# Patient Record
Sex: Female | Born: 1975 | Race: White | Hispanic: No | Marital: Single | Smoking: Former smoker
Health system: Southern US, Community
[De-identification: ages and names within clinical notes are randomized; demographics above are authoritative.]

## PROBLEM LIST (undated history)

## (undated) ENCOUNTER — Inpatient Hospital Stay (HOSPITAL_COMMUNITY): Payer: Self-pay

## (undated) DIAGNOSIS — Z973 Presence of spectacles and contact lenses: Secondary | ICD-10-CM

## (undated) DIAGNOSIS — R51 Headache: Secondary | ICD-10-CM

## (undated) DIAGNOSIS — M1990 Unspecified osteoarthritis, unspecified site: Secondary | ICD-10-CM

## (undated) DIAGNOSIS — F419 Anxiety disorder, unspecified: Secondary | ICD-10-CM

## (undated) DIAGNOSIS — K648 Other hemorrhoids: Secondary | ICD-10-CM

## (undated) DIAGNOSIS — Z8619 Personal history of other infectious and parasitic diseases: Secondary | ICD-10-CM

## (undated) DIAGNOSIS — K644 Residual hemorrhoidal skin tags: Secondary | ICD-10-CM

## (undated) DIAGNOSIS — J189 Pneumonia, unspecified organism: Secondary | ICD-10-CM

## (undated) DIAGNOSIS — G43909 Migraine, unspecified, not intractable, without status migrainosus: Secondary | ICD-10-CM

## (undated) DIAGNOSIS — Z87820 Personal history of traumatic brain injury: Secondary | ICD-10-CM

## (undated) DIAGNOSIS — L409 Psoriasis, unspecified: Secondary | ICD-10-CM

## (undated) DIAGNOSIS — K219 Gastro-esophageal reflux disease without esophagitis: Secondary | ICD-10-CM

## (undated) DIAGNOSIS — F32A Depression, unspecified: Secondary | ICD-10-CM

## (undated) DIAGNOSIS — F329 Major depressive disorder, single episode, unspecified: Secondary | ICD-10-CM

## (undated) HISTORY — PX: FOOT SURGERY: SHX648

---
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
## 2006-02-25 HISTORY — PX: DILATION AND CURETTAGE OF UTERUS: SHX78

## 2006-10-07 ENCOUNTER — Emergency Department (HOSPITAL_COMMUNITY): Admission: EM | Admit: 2006-10-07 | Discharge: 2006-10-07 | Payer: Self-pay | Admitting: Emergency Medicine

## 2006-10-30 ENCOUNTER — Emergency Department (HOSPITAL_COMMUNITY): Admission: EM | Admit: 2006-10-30 | Discharge: 2006-10-30 | Payer: Self-pay | Admitting: Emergency Medicine

## 2006-10-31 ENCOUNTER — Inpatient Hospital Stay (HOSPITAL_COMMUNITY): Admission: AD | Admit: 2006-10-31 | Discharge: 2006-10-31 | Payer: Self-pay | Admitting: Obstetrics & Gynecology

## 2006-11-06 ENCOUNTER — Inpatient Hospital Stay (HOSPITAL_COMMUNITY): Admission: AD | Admit: 2006-11-06 | Discharge: 2006-11-06 | Payer: Self-pay | Admitting: Obstetrics & Gynecology

## 2006-11-07 ENCOUNTER — Inpatient Hospital Stay (HOSPITAL_COMMUNITY): Admission: AD | Admit: 2006-11-07 | Discharge: 2006-11-07 | Payer: Self-pay | Admitting: Obstetrics & Gynecology

## 2006-11-13 ENCOUNTER — Inpatient Hospital Stay (HOSPITAL_COMMUNITY): Admission: RE | Admit: 2006-11-13 | Discharge: 2006-11-13 | Payer: Self-pay | Admitting: Obstetrics & Gynecology

## 2006-12-18 ENCOUNTER — Emergency Department (HOSPITAL_COMMUNITY): Admission: EM | Admit: 2006-12-18 | Discharge: 2006-12-18 | Payer: Self-pay | Admitting: Emergency Medicine

## 2007-04-21 ENCOUNTER — Inpatient Hospital Stay (HOSPITAL_COMMUNITY): Admission: AD | Admit: 2007-04-21 | Discharge: 2007-04-21 | Payer: Self-pay | Admitting: Obstetrics and Gynecology

## 2007-06-23 ENCOUNTER — Inpatient Hospital Stay (HOSPITAL_COMMUNITY): Admission: AD | Admit: 2007-06-23 | Discharge: 2007-06-25 | Payer: Self-pay | Admitting: Obstetrics and Gynecology

## 2007-11-17 ENCOUNTER — Inpatient Hospital Stay (HOSPITAL_COMMUNITY): Admission: AD | Admit: 2007-11-17 | Discharge: 2007-11-17 | Payer: Self-pay | Admitting: Obstetrics and Gynecology

## 2007-12-17 ENCOUNTER — Ambulatory Visit: Payer: Self-pay | Admitting: Obstetrics & Gynecology

## 2007-12-17 ENCOUNTER — Encounter: Payer: Self-pay | Admitting: Physician Assistant

## 2007-12-17 LAB — CONVERTED CEMR LAB
MCHC: 32.8 g/dL (ref 30.0–36.0)
RDW: 12.7 % (ref 11.5–15.5)
TSH: 0.454 microintl units/mL (ref 0.350–4.50)

## 2008-05-08 ENCOUNTER — Emergency Department (HOSPITAL_COMMUNITY): Admission: EM | Admit: 2008-05-08 | Discharge: 2008-05-08 | Payer: Self-pay | Admitting: Family Medicine

## 2008-11-01 ENCOUNTER — Ambulatory Visit: Payer: Self-pay | Admitting: Family Medicine

## 2008-11-01 ENCOUNTER — Inpatient Hospital Stay (HOSPITAL_COMMUNITY): Admission: AD | Admit: 2008-11-01 | Discharge: 2008-11-01 | Payer: Self-pay | Admitting: Family Medicine

## 2008-11-02 ENCOUNTER — Encounter: Admission: RE | Admit: 2008-11-02 | Discharge: 2008-11-02 | Payer: Self-pay | Admitting: Family Medicine

## 2008-11-13 ENCOUNTER — Inpatient Hospital Stay (HOSPITAL_COMMUNITY): Admission: AD | Admit: 2008-11-13 | Discharge: 2008-11-13 | Payer: Self-pay | Admitting: Obstetrics and Gynecology

## 2009-04-01 ENCOUNTER — Inpatient Hospital Stay (HOSPITAL_COMMUNITY): Admission: AD | Admit: 2009-04-01 | Discharge: 2009-04-01 | Payer: Self-pay | Admitting: Obstetrics & Gynecology

## 2009-05-18 ENCOUNTER — Ambulatory Visit: Payer: Self-pay | Admitting: Obstetrics and Gynecology

## 2009-05-25 ENCOUNTER — Inpatient Hospital Stay (HOSPITAL_COMMUNITY): Admission: AD | Admit: 2009-05-25 | Discharge: 2009-05-25 | Payer: Self-pay | Admitting: Family Medicine

## 2009-09-13 ENCOUNTER — Inpatient Hospital Stay (HOSPITAL_COMMUNITY): Admission: AD | Admit: 2009-09-13 | Discharge: 2009-09-13 | Payer: Self-pay | Admitting: Obstetrics & Gynecology

## 2009-12-05 ENCOUNTER — Emergency Department (HOSPITAL_COMMUNITY): Admission: EM | Admit: 2009-12-05 | Discharge: 2009-12-05 | Payer: Self-pay | Admitting: Emergency Medicine

## 2009-12-12 ENCOUNTER — Emergency Department (HOSPITAL_COMMUNITY): Admission: EM | Admit: 2009-12-12 | Discharge: 2009-12-12 | Payer: Self-pay | Admitting: Family Medicine

## 2010-01-01 ENCOUNTER — Ambulatory Visit: Payer: Self-pay | Admitting: Obstetrics and Gynecology

## 2010-01-01 ENCOUNTER — Encounter (INDEPENDENT_AMBULATORY_CARE_PROVIDER_SITE_OTHER): Payer: Self-pay | Admitting: *Deleted

## 2010-01-01 LAB — CONVERTED CEMR LAB: Chlamydia, DNA Probe: NEGATIVE

## 2010-01-02 ENCOUNTER — Encounter (INDEPENDENT_AMBULATORY_CARE_PROVIDER_SITE_OTHER): Payer: Self-pay | Admitting: *Deleted

## 2010-01-02 LAB — CONVERTED CEMR LAB: Yeast Wet Prep HPF POC: NONE SEEN

## 2010-03-19 ENCOUNTER — Ambulatory Visit: Admit: 2010-03-19 | Payer: Self-pay | Admitting: Obstetrics and Gynecology

## 2010-03-22 ENCOUNTER — Ambulatory Visit: Admission: RE | Admit: 2010-03-22 | Payer: Self-pay | Source: Home / Self Care | Admitting: Obstetrics and Gynecology

## 2010-04-05 ENCOUNTER — Ambulatory Visit: Payer: Self-pay | Admitting: Physician Assistant

## 2010-04-30 ENCOUNTER — Other Ambulatory Visit (HOSPITAL_COMMUNITY)
Admission: RE | Admit: 2010-04-30 | Discharge: 2010-04-30 | Disposition: A | Discharge status: Home / Self Care | Payer: Self-pay | Source: Ambulatory Visit | Attending: Obstetrics & Gynecology | Admitting: Obstetrics & Gynecology

## 2010-04-30 ENCOUNTER — Ambulatory Visit: Payer: Self-pay | Admitting: Occupational Therapy

## 2010-04-30 ENCOUNTER — Other Ambulatory Visit: Payer: Self-pay

## 2010-04-30 DIAGNOSIS — Z113 Encounter for screening for infections with a predominantly sexual mode of transmission: Secondary | ICD-10-CM | POA: Insufficient documentation

## 2010-04-30 DIAGNOSIS — Z01419 Encounter for gynecological examination (general) (routine) without abnormal findings: Secondary | ICD-10-CM | POA: Insufficient documentation

## 2010-04-30 LAB — POCT PREGNANCY, URINE: Preg Test, Ur: NEGATIVE

## 2010-05-01 ENCOUNTER — Encounter (INDEPENDENT_AMBULATORY_CARE_PROVIDER_SITE_OTHER): Payer: Self-pay | Admitting: *Deleted

## 2010-05-01 LAB — CONVERTED CEMR LAB
Trich, Wet Prep: NONE SEEN
WBC, Wet Prep HPF POC: NONE SEEN
Yeast Wet Prep HPF POC: NONE SEEN

## 2010-05-08 LAB — POCT PREGNANCY, URINE: Preg Test, Ur: NEGATIVE

## 2010-05-12 LAB — CBC
HCT: 41 % (ref 36.0–46.0)
MCH: 32.2 pg (ref 26.0–34.0)
MCV: 93.1 fL (ref 78.0–100.0)
Platelets: 230 10*3/uL (ref 150–400)
RBC: 4.4 MIL/uL (ref 3.87–5.11)

## 2010-05-12 LAB — WET PREP, GENITAL
Clue Cells Wet Prep HPF POC: NONE SEEN
Trich, Wet Prep: NONE SEEN

## 2010-05-12 LAB — URINALYSIS, ROUTINE W REFLEX MICROSCOPIC
Bilirubin Urine: NEGATIVE
Hgb urine dipstick: NEGATIVE
Ketones, ur: NEGATIVE mg/dL
Nitrite: NEGATIVE
pH: 5.5 (ref 5.0–8.0)

## 2010-05-12 LAB — GC/CHLAMYDIA PROBE AMP, GENITAL: Chlamydia, DNA Probe: NEGATIVE

## 2010-05-14 ENCOUNTER — Other Ambulatory Visit: Payer: Self-pay

## 2010-05-16 LAB — URINALYSIS, ROUTINE W REFLEX MICROSCOPIC
Bilirubin Urine: NEGATIVE
Ketones, ur: 15 mg/dL — AB
Nitrite: NEGATIVE
Protein, ur: NEGATIVE mg/dL
Urobilinogen, UA: 0.2 mg/dL (ref 0.0–1.0)
pH: 5 (ref 5.0–8.0)

## 2010-05-16 LAB — GC/CHLAMYDIA PROBE AMP, GENITAL: Chlamydia, DNA Probe: NEGATIVE
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
## 2010-05-20 LAB — WET PREP, GENITAL
Clue Cells Wet Prep HPF POC: NONE SEEN
Trich, Wet Prep: NONE SEEN

## 2010-05-20 LAB — GC/CHLAMYDIA PROBE AMP, GENITAL
Chlamydia, DNA Probe: NEGATIVE
GC Probe Amp, Genital: NEGATIVE

## 2010-06-25 ENCOUNTER — Inpatient Hospital Stay (HOSPITAL_COMMUNITY)
Admission: AD | Admit: 2010-06-25 | Discharge: 2010-06-25 | Disposition: A | Discharge status: Home / Self Care | Payer: Self-pay | Source: Ambulatory Visit | Attending: Obstetrics & Gynecology | Admitting: Obstetrics & Gynecology

## 2010-06-25 DIAGNOSIS — G43909 Migraine, unspecified, not intractable, without status migrainosus: Secondary | ICD-10-CM

## 2010-06-25 LAB — URINALYSIS, ROUTINE W REFLEX MICROSCOPIC
Glucose, UA: NEGATIVE mg/dL
Hgb urine dipstick: NEGATIVE
Ketones, ur: NEGATIVE mg/dL
Protein, ur: NEGATIVE mg/dL
pH: 5 (ref 5.0–8.0)

## 2010-06-25 LAB — WET PREP, GENITAL
Clue Cells Wet Prep HPF POC: NONE SEEN
Yeast Wet Prep HPF POC: NONE SEEN

## 2010-06-26 LAB — GC/CHLAMYDIA PROBE AMP, GENITAL: GC Probe Amp, Genital: NEGATIVE

## 2010-06-27 LAB — HERPES SIMPLEX VIRUS CULTURE
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
## 2010-07-16 ENCOUNTER — Ambulatory Visit: Payer: Self-pay | Admitting: Advanced Practice Midwife

## 2010-07-16 ENCOUNTER — Ambulatory Visit: Payer: Self-pay | Admitting: Physician Assistant

## 2010-07-26 ENCOUNTER — Inpatient Hospital Stay (HOSPITAL_COMMUNITY): Payer: Self-pay

## 2010-07-26 ENCOUNTER — Inpatient Hospital Stay (HOSPITAL_COMMUNITY)
Admission: AD | Admit: 2010-07-26 | Discharge: 2010-07-26 | Disposition: A | Discharge status: Home / Self Care | Payer: Self-pay | Source: Ambulatory Visit | Attending: Obstetrics & Gynecology | Admitting: Obstetrics & Gynecology

## 2010-07-26 DIAGNOSIS — R109 Unspecified abdominal pain: Secondary | ICD-10-CM | POA: Insufficient documentation

## 2010-07-26 DIAGNOSIS — IMO0002 Reserved for concepts with insufficient information to code with codable children: Secondary | ICD-10-CM | POA: Insufficient documentation

## 2010-07-26 LAB — URINALYSIS, ROUTINE W REFLEX MICROSCOPIC
Ketones, ur: NEGATIVE mg/dL
Nitrite: NEGATIVE
Protein, ur: NEGATIVE mg/dL
Urobilinogen, UA: 0.2 mg/dL (ref 0.0–1.0)

## 2010-07-26 LAB — WET PREP, GENITAL
Clue Cells Wet Prep HPF POC: NONE SEEN
Yeast Wet Prep HPF POC: NONE SEEN

## 2010-07-26 LAB — CBC
Platelets: 217 10*3/uL (ref 150–400)
RBC: 4.09 MIL/uL (ref 3.87–5.11)
RDW: 12.3 % (ref 11.5–15.5)
WBC: 4.3 10*3/uL (ref 4.0–10.5)

## 2010-07-26 LAB — DIFFERENTIAL
Basophils Relative: 1 % (ref 0–1)
Eosinophils Absolute: 0 10*3/uL (ref 0.0–0.7)
Eosinophils Relative: 1 % (ref 0–5)
Neutrophils Relative %: 65 % (ref 43–77)

## 2010-07-26 LAB — POCT PREGNANCY, URINE: Preg Test, Ur: NEGATIVE

## 2010-08-10 ENCOUNTER — Ambulatory Visit: Payer: Self-pay | Admitting: Advanced Practice Midwife

## 2010-09-03 ENCOUNTER — Telehealth: Payer: Self-pay | Admitting: *Deleted

## 2010-09-03 NOTE — Telephone Encounter (Signed)
Patient has questions regarding birth control pills and no period.  States she had a negative urine pregnancy test.  Is this possible to skip a period on pills or do I need to take another pregnancy test.  Spoke with patient via phone.  Patient states she had Depo Provera shot in October and then decided not to continue it.  States she has only had one period since injection in October.  States she has completed one pack of pills and did not have a period.  She states she had a negative pregnancy test and started her second pack of pills.  Explained to patient it may take a few months to regulate her periods after having been on depo provera.  Patient states understanding.  Encouraged to call back with additional questions.

## 2010-09-12 ENCOUNTER — Inpatient Hospital Stay (INDEPENDENT_AMBULATORY_CARE_PROVIDER_SITE_OTHER)
Admission: RE | Admit: 2010-09-12 | Discharge: 2010-09-12 | Disposition: A | Discharge status: Home / Self Care | Payer: Self-pay | Source: Ambulatory Visit | Attending: Family Medicine | Admitting: Family Medicine

## 2010-09-12 DIAGNOSIS — F411 Generalized anxiety disorder: Secondary | ICD-10-CM

## 2010-09-21 ENCOUNTER — Other Ambulatory Visit: Payer: Self-pay | Admitting: *Deleted

## 2010-09-21 NOTE — Telephone Encounter (Signed)
09/19/10 4:17 pt. Left message she had called 2 weeks ago trying to get fioricet renewed, spoke to someone said would check with doctor and call her back- wants to know if it was renewed. 09/20/10 0-0916 per note in chart  Per Maylon Cos, CNM- pt cannot get renewal without being seen because has [facility] x2, front office left message for pt 09/14/10 offering pt an appointment that day, also made appt 10/25/10 in case she wanted that.  Today called pt and left message we are returning your call- please call clinic to discuss your request. Raynald Blend, RN 09/21/10 0901 called pt and left message we are trying to return your call- if you still need assistance, please call clinic.

## 2010-09-24 ENCOUNTER — Telehealth: Payer: Self-pay

## 2010-09-24 NOTE — Telephone Encounter (Signed)
Called pt and left message to return our call.

## 2010-09-26 ENCOUNTER — Telehealth: Payer: Self-pay | Admitting: *Deleted

## 2010-09-26 NOTE — Telephone Encounter (Signed)
Left message for patient that she needs to schedule appointment and that we will not refill Rx.

## 2010-09-27 ENCOUNTER — Telehealth: Payer: Self-pay | Admitting: *Deleted

## 2010-09-27 NOTE — Telephone Encounter (Signed)
Called pt again, left message for pt to call clinic- we are returning her call and trying to answer her question- pt requested rx refill for flexeril- that was denied- needs to be seen in clinic as she has dnka 2 appointments- has appt made 10/25/10

## 2010-09-27 NOTE — Telephone Encounter (Signed)
Pt returned our call 09/26/10 at 4:04 stating she understands she must be seen before gets new rx, knows appointment is August 30th , but asks what do I do between now and then?  Called pt. And left message if she can not wait until her appointment can go to [facility] or her primary care MD

## 2010-10-12 ENCOUNTER — Inpatient Hospital Stay (HOSPITAL_COMMUNITY)
Admission: AD | Admit: 2010-10-12 | Discharge: 2010-10-12 | Disposition: A | Discharge status: Home / Self Care | Payer: Self-pay | Source: Ambulatory Visit | Attending: Obstetrics & Gynecology | Admitting: Obstetrics & Gynecology

## 2010-10-12 ENCOUNTER — Encounter (HOSPITAL_COMMUNITY): Payer: Self-pay | Admitting: Obstetrics and Gynecology

## 2010-10-12 DIAGNOSIS — L408 Other psoriasis: Secondary | ICD-10-CM | POA: Insufficient documentation

## 2010-10-12 DIAGNOSIS — N9089 Other specified noninflammatory disorders of vulva and perineum: Secondary | ICD-10-CM

## 2010-10-12 DIAGNOSIS — N909 Noninflammatory disorder of vulva and perineum, unspecified: Secondary | ICD-10-CM | POA: Insufficient documentation

## 2010-10-12 DIAGNOSIS — L259 Unspecified contact dermatitis, unspecified cause: Secondary | ICD-10-CM | POA: Insufficient documentation

## 2010-10-12 HISTORY — DX: Anxiety disorder, unspecified: F41.9

## 2010-10-12 HISTORY — DX: Major depressive disorder, single episode, unspecified: F32.9

## 2010-10-12 HISTORY — DX: Depression, unspecified: F32.A

## 2010-10-12 LAB — WET PREP, GENITAL
Trich, Wet Prep: NONE SEEN
Yeast Wet Prep HPF POC: NONE SEEN

## 2010-10-12 MED ORDER — NYSTATIN-TRIAMCINOLONE 100000-0.1 UNIT/GM-% EX OINT: TOPICAL_OINTMENT | Freq: Two times a day (BID) | CUTANEOUS | Status: DC

## 2010-10-12 MED ORDER — BUTALBITAL-APAP-CAFFEINE 50-325-40 MG PO TABS: 1.0000 | ORAL_TABLET | Freq: Four times a day (QID) | ORAL | Status: DC | PRN

## 2010-10-12 MED ORDER — NYSTATIN-TRIAMCINOLONE 100000-0.1 UNIT/GM-% EX CREA
TOPICAL_CREAM | Freq: Two times a day (BID) | CUTANEOUS | Status: DC
  Filled 2010-10-12: qty 15

## 2010-10-12 NOTE — Progress Notes (Signed)
Pt statesd, " I started having white vaginal discharge on Wed, and today I have vaginal irritation that started today."

## 2010-10-12 NOTE — ED Provider Notes (Addendum)
History     Chief Complaint  Patient presents with  . Vaginal Discharge  . Vaginal Pain   HPI Pt complains fo whtie vaginal discharge for 2 days with irritation.  She is using Sprintec birth control.  She has an appointment with the GYN for bx of vulvar   Past Medical History  Diagnosis Date  . Anxiety   . Depression     Past Surgical History  Procedure Date  . Foot surgery 1998 & 2000    Bone spur removal from both feet  . Wisdom tooth extraction 1997    No family history on file.  History  Substance Use Topics  . Smoking status: Former Smoker -- 0.2 packs/day for 10 years    Types: Cigarettes  . Smokeless tobacco: Not on file  . Alcohol Use: 0.6 oz/week    1 Glasses of wine per week     occassionally    Allergies: No Known Allergies  No prescriptions prior to admission    ROS Physical Exam   Blood pressure 128/84, pulse 76, temperature 99.4 F (37.4 C), temperature source Oral, resp. rate 20, height 5' 2.5" (1.588 m), weight 120 lb 8 oz (54.658 kg), last menstrual period 09/25/2010.  Physical Exam  Vitals reviewed. Constitutional: She is oriented to person, place, and time. She appears well-developed and well-nourished.  HENT:  Head: Normocephalic.  Eyes: Pupils are equal, round, and reactive to light.  Neck: Normal range of motion. Neck supple.  Respiratory: Effort normal.  Genitourinary:       Pt has left scaly, slightly flaky reddened area that has a small fissure between the labia minora and the labia minora- appears to be psoriasis or eczema- pt says she also has some patches on her scalp.  Vagina is clean with small amount of white discharge, mucosa pale pink; cervix clean nontender; adnexal without palpable enlargement or tenderness.  Pt is very anxious and concerned this may be cancer or an STD.  Musculoskeletal: Normal range of motion.  Neurological: She is alert and oriented to person, place, and time. She has normal reflexes.  Skin: Skin is  warm and dry.    [facility] Course  Procedures Pelvic exam performed-with wet prep and GC/chlamdyia cultures MDM Will treat vulvar irritation with Mycolog  Pt requests Fioricet for headaches until she can get another presciption.  Discussed menstrual migraines (without aura) and recommended pt do continuous birth control pills for 3 months and only take 4 days off of active pills.   Pt will discuss at GYN visit Assessment and Plan  Vulvar irritation likely psoriasis or eczema will treat with mod steriod and have pt f/u in GYN clinic (nystatin/triamcinolone)- pharmacy has cream but will give prescription for ointment as that may work better.  Taneshia Lorence 10/12/2010, 8:09 PM

## 2010-10-13 LAB — GC/CHLAMYDIA PROBE AMP, GENITAL: GC Probe Amp, Genital: NEGATIVE

## 2010-10-25 ENCOUNTER — Ambulatory Visit: Payer: Self-pay | Admitting: Physician Assistant

## 2010-11-19 DIAGNOSIS — Z87898 Personal history of other specified conditions: Secondary | ICD-10-CM | POA: Insufficient documentation

## 2010-11-19 DIAGNOSIS — Z8669 Personal history of other diseases of the nervous system and sense organs: Secondary | ICD-10-CM | POA: Insufficient documentation

## 2010-11-20 LAB — CBC
HCT: 32.1 — ABNORMAL LOW
Hemoglobin: 9.6 — ABNORMAL LOW
MCHC: 35.4
MCV: 92.6
Platelets: 232
Platelets: 257
RDW: 13.3
RDW: 14
WBC: 8.8

## 2010-11-20 LAB — RPR: RPR Ser Ql: NONREACTIVE

## 2010-11-21 ENCOUNTER — Encounter: Payer: Self-pay | Admitting: Family Medicine

## 2010-11-21 ENCOUNTER — Ambulatory Visit: Payer: Self-pay | Admitting: Obstetrics and Gynecology

## 2010-11-26 LAB — URINALYSIS, ROUTINE W REFLEX MICROSCOPIC
Hgb urine dipstick: NEGATIVE
Nitrite: NEGATIVE
Protein, ur: NEGATIVE
Specific Gravity, Urine: 1.025
Urobilinogen, UA: 0.2

## 2010-11-26 LAB — GC/CHLAMYDIA PROBE AMP, GENITAL: GC Probe Amp, Genital: NEGATIVE

## 2010-11-26 LAB — WET PREP, GENITAL
Clue Cells Wet Prep HPF POC: NONE SEEN
Yeast Wet Prep HPF POC: NONE SEEN

## 2010-12-07 LAB — URINALYSIS, ROUTINE W REFLEX MICROSCOPIC
Hgb urine dipstick: NEGATIVE
Nitrite: NEGATIVE
Specific Gravity, Urine: 1.01
Urobilinogen, UA: 0.2

## 2010-12-07 LAB — URINE MICROSCOPIC-ADD ON

## 2010-12-07 LAB — POCT PREGNANCY, URINE
Preg Test, Ur: NEGATIVE
Preg Test, Ur: POSITIVE

## 2010-12-07 LAB — CBC
MCHC: 35.7
MCV: 89.4
Platelets: 302
RBC: 4.18
RDW: 11.9

## 2010-12-07 LAB — ABO/RH: ABO/RH(D): B POS

## 2010-12-07 LAB — HCG, QUANTITATIVE, PREGNANCY: hCG, Beta Chain, Quant, S: 560 — ABNORMAL HIGH

## 2010-12-07 LAB — WET PREP, GENITAL

## 2010-12-08 ENCOUNTER — Encounter (HOSPITAL_BASED_OUTPATIENT_CLINIC_OR_DEPARTMENT_OTHER): Payer: Self-pay | Admitting: *Deleted

## 2010-12-08 ENCOUNTER — Emergency Department (HOSPITAL_BASED_OUTPATIENT_CLINIC_OR_DEPARTMENT_OTHER)
Admission: EM | Admit: 2010-12-08 | Discharge: 2010-12-08 | Disposition: A | Discharge status: Home / Self Care | Payer: Self-pay | Attending: Emergency Medicine | Admitting: Emergency Medicine

## 2010-12-08 DIAGNOSIS — Z79899 Other long term (current) drug therapy: Secondary | ICD-10-CM | POA: Insufficient documentation

## 2010-12-08 DIAGNOSIS — F341 Dysthymic disorder: Secondary | ICD-10-CM | POA: Insufficient documentation

## 2010-12-08 DIAGNOSIS — K1379 Other lesions of oral mucosa: Secondary | ICD-10-CM

## 2010-12-08 DIAGNOSIS — K1370 Unspecified lesions of oral mucosa: Secondary | ICD-10-CM | POA: Insufficient documentation

## 2010-12-08 HISTORY — DX: Psoriasis, unspecified: L40.9

## 2010-12-08 MED ORDER — AMOXICILLIN 500 MG PO CAPS
500.0000 mg | ORAL_CAPSULE | Freq: Once | ORAL | Status: AC
  Administered 2010-12-08: 500 mg via ORAL
  Filled 2010-12-08: qty 1

## 2010-12-08 MED ORDER — AMOXICILLIN 500 MG PO CAPS: 500.0000 mg | ORAL_CAPSULE | Freq: Three times a day (TID) | ORAL | Status: AC

## 2010-12-08 NOTE — ED Notes (Signed)
Pt states she noticed a lump on the inside of her left cheek on Wed. Now c/o irritation to same. Googled and is now concerned she has cancer.

## 2010-12-08 NOTE — ED Notes (Signed)
Pt has escript for amoxicillin called in- verbalizes understanding
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
## 2010-12-08 NOTE — ED Provider Notes (Signed)
Medical screening examination/treatment/procedure(s) were performed by non-physician practitioner and as supervising physician I was immediately available for consultation/collaboration.   Hanley Seamen, MD 12/08/10 2053

## 2010-12-18 ENCOUNTER — Other Ambulatory Visit: Payer: Self-pay | Admitting: Obstetrics & Gynecology

## 2010-12-24 ENCOUNTER — Inpatient Hospital Stay (HOSPITAL_COMMUNITY)
Admission: AD | Admit: 2010-12-24 | Discharge: 2010-12-24 | Disposition: A | Discharge status: Home / Self Care | Payer: Self-pay | Source: Ambulatory Visit | Attending: Obstetrics & Gynecology | Admitting: Obstetrics & Gynecology

## 2010-12-24 DIAGNOSIS — K644 Residual hemorrhoidal skin tags: Secondary | ICD-10-CM | POA: Insufficient documentation

## 2010-12-24 DIAGNOSIS — N760 Acute vaginitis: Secondary | ICD-10-CM | POA: Insufficient documentation

## 2010-12-24 LAB — WET PREP, GENITAL
Clue Cells Wet Prep HPF POC: NONE SEEN
Trich, Wet Prep: NONE SEEN
Yeast Wet Prep HPF POC: NONE SEEN
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
## 2010-12-24 NOTE — Progress Notes (Signed)
Patient states she woke up this am with a vaginal odor and a slight white discharge with slight itching. Patient states she used OTC medication for yeast last week.

## 2010-12-25 LAB — GC/CHLAMYDIA PROBE AMP, GENITAL: GC Probe Amp, Genital: NEGATIVE

## 2010-12-27 NOTE — ED Provider Notes (Signed)
Attestation of Attending Supervision of Advanced Practitioner: Evaluation and management procedures were performed by the PA/NP/CNM/OB Fellow under my supervision/collaboration. Chart reviewed and agree with management and plan.  Melenie Minniear A M.D. 12/27/2010 2:53 PM   

## 2011-01-21 ENCOUNTER — Inpatient Hospital Stay (HOSPITAL_COMMUNITY)
Admission: AD | Admit: 2011-01-21 | Discharge: 2011-01-21 | Disposition: A | Discharge status: Home / Self Care | Payer: Self-pay | Source: Ambulatory Visit | Attending: Obstetrics & Gynecology | Admitting: Obstetrics & Gynecology

## 2011-01-21 ENCOUNTER — Encounter (HOSPITAL_COMMUNITY): Payer: Self-pay | Admitting: *Deleted

## 2011-01-21 DIAGNOSIS — K649 Unspecified hemorrhoids: Secondary | ICD-10-CM | POA: Insufficient documentation

## 2011-01-21 DIAGNOSIS — K644 Residual hemorrhoidal skin tags: Secondary | ICD-10-CM

## 2011-01-21 HISTORY — DX: Headache: R51

## 2011-01-21 MED ORDER — HYDROCORTISONE ACE-PRAMOXINE 1-1 % RE FOAM
1.0000 | Freq: Two times a day (BID) | RECTAL | Status: DC
  Filled 2011-01-21: qty 10

## 2011-01-21 MED ORDER — HYDROCORTISONE ACE-PRAMOXINE 1-1 % RE FOAM: 1.0000 | Freq: Two times a day (BID) | RECTAL | Status: AC

## 2011-01-21 NOTE — Progress Notes (Signed)
Has hemorrhoids.  Was really itching, tried the creams, ? If open, might be one or two tears

## 2011-01-21 NOTE — Progress Notes (Signed)
Denies pain, just itching and burning.
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
## 2011-02-02 ENCOUNTER — Inpatient Hospital Stay (HOSPITAL_COMMUNITY)
Admission: AD | Admit: 2011-02-02 | Discharge: 2011-02-02 | Disposition: A | Discharge status: Home / Self Care | Payer: Self-pay | Source: Ambulatory Visit | Attending: Obstetrics and Gynecology | Admitting: Obstetrics and Gynecology

## 2011-02-02 ENCOUNTER — Encounter (HOSPITAL_COMMUNITY): Payer: Self-pay | Admitting: *Deleted

## 2011-02-02 DIAGNOSIS — N760 Acute vaginitis: Secondary | ICD-10-CM

## 2011-02-02 DIAGNOSIS — N762 Acute vulvitis: Secondary | ICD-10-CM

## 2011-02-02 DIAGNOSIS — N949 Unspecified condition associated with female genital organs and menstrual cycle: Secondary | ICD-10-CM | POA: Insufficient documentation

## 2011-02-02 DIAGNOSIS — J069 Acute upper respiratory infection, unspecified: Secondary | ICD-10-CM | POA: Insufficient documentation

## 2011-02-02 MED ORDER — NYSTATIN-TRIAMCINOLONE 100000-0.1 UNIT/GM-% EX CREA: TOPICAL_CREAM | CUTANEOUS | Status: DC

## 2011-02-02 MED ORDER — OSELTAMIVIR PHOSPHATE 75 MG PO CAPS: 75.0000 mg | ORAL_CAPSULE | Freq: Two times a day (BID) | ORAL | Status: DC

## 2011-02-02 NOTE — Progress Notes (Signed)
Pt in c/o vaginal itching and irritation after intercourse yesterday.  Also reports throat redness and pain with swallowing since 0400.  Reports dryness in throat x1 month.
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
## 2011-02-02 NOTE — Progress Notes (Signed)
Pt had intercourse yesterday and started having vaginal burning/irritation. Also reports having throat/ear pain like she is coming down with Strep.Feels ache all over.

## 2011-02-03 NOTE — ED Provider Notes (Signed)
Agree with above note.  [name] 02/03/2011 7:18 AM

## 2011-02-04 LAB — GC/CHLAMYDIA PROBE AMP, GENITAL
Chlamydia, DNA Probe: NEGATIVE
GC Probe Amp, Genital: NEGATIVE

## 2011-02-05 ENCOUNTER — Emergency Department (HOSPITAL_BASED_OUTPATIENT_CLINIC_OR_DEPARTMENT_OTHER)
Admission: EM | Admit: 2011-02-05 | Discharge: 2011-02-05 | Discharge status: Against Medical Advice | Payer: Self-pay | Attending: Emergency Medicine | Admitting: Emergency Medicine

## 2011-02-05 ENCOUNTER — Encounter (HOSPITAL_BASED_OUTPATIENT_CLINIC_OR_DEPARTMENT_OTHER): Payer: Self-pay

## 2011-02-05 DIAGNOSIS — J029 Acute pharyngitis, unspecified: Secondary | ICD-10-CM | POA: Insufficient documentation

## 2011-02-05 DIAGNOSIS — F411 Generalized anxiety disorder: Secondary | ICD-10-CM | POA: Insufficient documentation

## 2011-02-05 NOTE — ED Notes (Signed)
Pt called in wanting test results

## 2011-02-05 NOTE — ED Notes (Signed)
Pt reports onset of sore throat, dry mouth, difficulty swallowing x 2 months.  She reports feeling anxious and chest tightness after "googling my symptoms".

## 2011-02-18 ENCOUNTER — Encounter (HOSPITAL_COMMUNITY): Payer: Self-pay

## 2011-02-18 ENCOUNTER — Inpatient Hospital Stay (HOSPITAL_COMMUNITY)
Admission: AD | Admit: 2011-02-18 | Discharge: 2011-02-18 | Disposition: A | Discharge status: Home / Self Care | Payer: Self-pay | Source: Ambulatory Visit | Attending: Obstetrics & Gynecology | Admitting: Obstetrics & Gynecology

## 2011-02-18 ENCOUNTER — Inpatient Hospital Stay (HOSPITAL_COMMUNITY): Payer: Self-pay

## 2011-02-18 DIAGNOSIS — O99891 Other specified diseases and conditions complicating pregnancy: Secondary | ICD-10-CM | POA: Insufficient documentation

## 2011-02-18 DIAGNOSIS — R109 Unspecified abdominal pain: Secondary | ICD-10-CM | POA: Insufficient documentation

## 2011-02-18 DIAGNOSIS — O26899 Other specified pregnancy related conditions, unspecified trimester: Secondary | ICD-10-CM

## 2011-02-18 LAB — CBC
HCT: 38.3 % (ref 36.0–46.0)
Hemoglobin: 13.2 g/dL (ref 12.0–15.0)
MCH: 31.2 pg (ref 26.0–34.0)
MCV: 90.5 fL (ref 78.0–100.0)
RBC: 4.23 MIL/uL (ref 3.87–5.11)

## 2011-02-18 LAB — URINALYSIS, ROUTINE W REFLEX MICROSCOPIC
Bilirubin Urine: NEGATIVE
Hgb urine dipstick: NEGATIVE
Specific Gravity, Urine: >1.03 — ABNORMAL HIGH (ref 1.005–1.030)
pH: 5 (ref 5.0–8.0)

## 2011-02-18 LAB — DIFFERENTIAL
Eosinophils Absolute: 0.1 10*3/uL (ref 0.0–0.7)
Eosinophils Relative: 1 % (ref 0–5)
Lymphs Abs: 1.6 10*3/uL (ref 0.7–4.0)
Monocytes Absolute: 0.5 10*3/uL (ref 0.1–1.0)
Monocytes Relative: 6 % (ref 3–12)

## 2011-02-18 LAB — ABO/RH: ABO/RH(D): B POS
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
## 2011-02-18 NOTE — Progress Notes (Signed)
Pt states recently found out she was pregnant, having left sided lower abd pain only. Pain less than when she first checked in. Hx prior miscarriage. Normal vaginal d/c noted.

## 2011-02-18 NOTE — Progress Notes (Signed)
Unsure of period/ovulation, had started pills late.  Had neg test 3 wks ago, was pos last wk

## 2011-02-18 NOTE — Progress Notes (Signed)
Been having pain on lower left side for past wk.  Found out on Thurs is preg.

## 2011-02-19 NOTE — ED Provider Notes (Signed)
Agree with above note.  [name] H. 02/19/2011 2:22 AM

## 2011-02-20 ENCOUNTER — Ambulatory Visit: Payer: Self-pay | Admitting: Family

## 2011-02-22 ENCOUNTER — Encounter (HOSPITAL_COMMUNITY): Payer: Self-pay

## 2011-02-26 NOTE — L&D Delivery Note (Signed)
Operative Delivery Note At 6:29 AM a viable and healthy female was delivered via Vaginal, Spontaneous Delivery.  Presentation: vertex; Position: Right,, Occiput,, Anterior; Station: +3  Verbal consent: obtained from patient.  Risks and benefits discussed in detail.  Risks include, but are not limited to the risks of anesthesia, bleeding, infection, damage to maternal tissues, fetal cephalhematoma.  There is also the risk of inability to effect vaginal delivery of the head, or shoulder dystocia that cannot be resolved by established maneuvers, leading to the need for emergency cesarean section.  Pt had been having late decels on/off through out the night.  With pushing, the late decelerations became more severe to the 70s with slow return to baseline.  When the patient SROM'd thick meconium was noted.  The NICU team was called to attend delivery due to these findings. The vacuum was applied to the vertex and with maternal pushing the head was delivered with no pop-offs.  The vacuum was disengaged and the body was delivered.  The infant cried vigorously at delivery.  The cord was clamped and cut and the infant passed to the NICU team.  The placenta was delivered spontaneously, intact, with 3VC.  No lacerations required repair. Mother and baby doing well after delivery  APGAR: 7 ,9 ; weight 6#11   Placenta status: Intact, Spontaneous.   Cord: 3 vessels   Anesthesia: Epidural  Instruments: kiwi vacuum Episiotomy: None Lacerations: None Suture Repair: NA Est. Blood Loss (mL): 250  Mom to postpartum.  Baby to nursery-stable.  Gee Habig H. 10/07/2011, 6:39 AM

## 2011-02-26 DEATH — deceased

## 2011-03-01 ENCOUNTER — Inpatient Hospital Stay (HOSPITAL_COMMUNITY)
Admission: AD | Admit: 2011-03-01 | Discharge: 2011-03-01 | Disposition: A | Discharge status: Home / Self Care | Payer: Self-pay | Source: Ambulatory Visit | Attending: Obstetrics & Gynecology | Admitting: Obstetrics & Gynecology

## 2011-03-01 ENCOUNTER — Encounter (HOSPITAL_COMMUNITY): Payer: Self-pay

## 2011-03-01 DIAGNOSIS — O26899 Other specified pregnancy related conditions, unspecified trimester: Secondary | ICD-10-CM

## 2011-03-01 DIAGNOSIS — O99891 Other specified diseases and conditions complicating pregnancy: Secondary | ICD-10-CM | POA: Insufficient documentation

## 2011-03-01 DIAGNOSIS — R109 Unspecified abdominal pain: Secondary | ICD-10-CM | POA: Insufficient documentation

## 2011-03-01 LAB — CBC
MCV: 90.5 fL (ref 78.0–100.0)
Platelets: 219 10*3/uL (ref 150–400)
RBC: 4.02 MIL/uL (ref 3.87–5.11)
WBC: 6.6 10*3/uL (ref 4.0–10.5)

## 2011-03-01 LAB — COMPREHENSIVE METABOLIC PANEL
ALT: 22 U/L (ref 0–35)
AST: 24 U/L (ref 0–37)
Alkaline Phosphatase: 69 U/L (ref 39–117)
CO2: 27 mEq/L (ref 19–32)
Chloride: 99 mEq/L (ref 96–112)
Creatinine, Ser: 0.59 mg/dL (ref 0.50–1.10)
GFR calc non Af Amer: >90 mL/min (ref >90)
Sodium: 135 mEq/L (ref 135–145)
Total Bilirubin: 0.3 mg/dL (ref 0.3–1.2)

## 2011-03-01 LAB — URINALYSIS, ROUTINE W REFLEX MICROSCOPIC
Glucose, UA: NEGATIVE mg/dL
Hgb urine dipstick: NEGATIVE
Specific Gravity, Urine: <1.005 — ABNORMAL LOW (ref 1.005–1.030)
Urobilinogen, UA: 0.2 mg/dL (ref 0.0–1.0)

## 2011-03-01 NOTE — Progress Notes (Signed)
Patient is here with c/o left mid to lower quadrant pain that started at 1500pm. She states that the pain is constant but changed in intensity. She denies any vaginal bleedimg. She states that had u/s 2 weeks ago and was told she had an iup. She is awaiting her insurance to begin prenatal; care

## 2011-03-01 NOTE — Progress Notes (Signed)
Onset of left side pain since 3:30 this afternoon, no bowel movement since yesterday, nausea around [redacted]w[redacted]d, no vaginal bleeding.
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
## 2011-03-12 NOTE — ED Provider Notes (Signed)
Agree with above note.  [name] H. 03/12/2011 8:01 PM

## 2011-04-09 ENCOUNTER — Encounter (HOSPITAL_COMMUNITY): Payer: Self-pay | Admitting: *Deleted

## 2011-04-09 ENCOUNTER — Inpatient Hospital Stay (HOSPITAL_COMMUNITY)
Admission: AD | Admit: 2011-04-09 | Discharge: 2011-04-09 | Disposition: A | Discharge status: Home / Self Care | Payer: Medicaid Other | Source: Ambulatory Visit | Attending: Obstetrics and Gynecology | Admitting: Obstetrics and Gynecology

## 2011-04-09 DIAGNOSIS — O9989 Other specified diseases and conditions complicating pregnancy, childbirth and the puerperium: Secondary | ICD-10-CM

## 2011-04-09 DIAGNOSIS — N949 Unspecified condition associated with female genital organs and menstrual cycle: Secondary | ICD-10-CM

## 2011-04-09 DIAGNOSIS — R109 Unspecified abdominal pain: Secondary | ICD-10-CM | POA: Insufficient documentation

## 2011-04-09 DIAGNOSIS — O99891 Other specified diseases and conditions complicating pregnancy: Secondary | ICD-10-CM | POA: Insufficient documentation

## 2011-04-09 DIAGNOSIS — O26899 Other specified pregnancy related conditions, unspecified trimester: Secondary | ICD-10-CM

## 2011-04-09 LAB — URINALYSIS, ROUTINE W REFLEX MICROSCOPIC
Bilirubin Urine: NEGATIVE
Hgb urine dipstick: NEGATIVE
Ketones, ur: NEGATIVE mg/dL
Protein, ur: NEGATIVE mg/dL
Urobilinogen, UA: 0.2 mg/dL (ref 0.0–1.0)
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
## 2011-04-09 NOTE — Discharge Instructions (Signed)

## 2011-04-09 NOTE — Progress Notes (Signed)
Pt states lower abdominal pain, feels very uncomfortable. Abdomen feels tight. Noted spotting yesterday. Headaches are much more severe now, has appt with [facility]. Notes mucus like d/c, nonodorous.

## 2011-04-10 LAB — GC/CHLAMYDIA PROBE AMP, GENITAL
Chlamydia, DNA Probe: NEGATIVE
GC Probe Amp, Genital: NEGATIVE

## 2011-04-22 ENCOUNTER — Other Ambulatory Visit: Payer: Self-pay | Admitting: Obstetrics and Gynecology

## 2011-05-31 ENCOUNTER — Encounter (HOSPITAL_COMMUNITY): Payer: Self-pay

## 2011-05-31 ENCOUNTER — Inpatient Hospital Stay (HOSPITAL_COMMUNITY)
Admission: AD | Admit: 2011-05-31 | Discharge: 2011-05-31 | Disposition: A | Discharge status: Home / Self Care | Payer: Medicaid Other | Source: Ambulatory Visit | Attending: Obstetrics and Gynecology | Admitting: Obstetrics and Gynecology

## 2011-05-31 DIAGNOSIS — K5900 Constipation, unspecified: Secondary | ICD-10-CM

## 2011-05-31 DIAGNOSIS — R109 Unspecified abdominal pain: Secondary | ICD-10-CM | POA: Insufficient documentation

## 2011-05-31 DIAGNOSIS — O99891 Other specified diseases and conditions complicating pregnancy: Secondary | ICD-10-CM | POA: Insufficient documentation

## 2011-05-31 DIAGNOSIS — O26899 Other specified pregnancy related conditions, unspecified trimester: Secondary | ICD-10-CM

## 2011-05-31 LAB — CBC
MCH: 31.1 pg (ref 26.0–34.0)
MCV: 91 fL (ref 78.0–100.0)
Platelets: 219 10*3/uL (ref 150–400)
RDW: 13.5 % (ref 11.5–15.5)
WBC: 8.6 10*3/uL (ref 4.0–10.5)

## 2011-05-31 LAB — URINALYSIS, ROUTINE W REFLEX MICROSCOPIC
Bilirubin Urine: NEGATIVE
Hgb urine dipstick: NEGATIVE
Nitrite: NEGATIVE
Protein, ur: NEGATIVE mg/dL
Specific Gravity, Urine: 1.01 (ref 1.005–1.030)
Urobilinogen, UA: 0.2 mg/dL (ref 0.0–1.0)

## 2011-05-31 MED ORDER — FLEET ENEMA 7-19 GM/118ML RE ENEM: 1.0000 | ENEMA | Freq: Once | RECTAL | Status: DC

## 2011-05-31 MED ORDER — ALUM & MAG HYDROXIDE-SIMETH 200-200-20 MG/5ML PO SUSP
30.0000 mL | Freq: Once | ORAL | Status: AC
  Administered 2011-05-31: 30 mL via ORAL
  Filled 2011-05-31: qty 30
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
## 2011-05-31 NOTE — MAU Note (Signed)
Pt reports onset pain in left side . Left low abdomen at 9 pm last night, relieved somewhat with warm shower, and finally was able to sleep at 0430 am. She awoke at at 0900 am with continued constant achy cramp.

## 2011-05-31 NOTE — Discharge Instructions (Signed)
Abdominal Pain During Pregnancy  Abdominal discomfort is common in pregnancy. Most of the time, it does not cause harm. There are many causes of abdominal pain. Some causes are more serious than others. Some of the causes of abdominal pain in pregnancy are easily diagnosed. Occasionally, the diagnosis takes time to understand. Other times, the cause is not determined. Abdominal pain can be a sign that something is very wrong with the pregnancy, or the pain may have nothing to do with the pregnancy at all. For this reason, always tell your caregiver if you have any abdominal discomfort.  CAUSES  Common and harmless causes of abdominal pain include:   Constipation.   Excess gas and bloating.   Round ligament pain. This is pain that is felt in the folds of the groin.   The position the baby or placenta is in.   Baby kicks.   Braxton-Hicks contractions. These are mild contractions that do not cause cervical dilation.  Serious causes of abdominal pain include:   Ectopic pregnancy. This happens when a fertilized egg implants outside of the uterus.   Miscarriage.   Preterm labor. This is when labor starts at less than 37 weeks of pregnancy.   Placental abruption. This is when the placenta partially or completely separates from the uterus.   Preeclampsia. This is often associated with high blood pressure and has been referred to as "toxemia in pregnancy."   Uterine or amniotic fluid infections.  Causes unrelated to pregnancy include:   Urinary tract infection.   Gallbladder stones or inflammation.   Hepatitis or other liver illness.   Intestinal problems, stomach flu, food poisoning, or ulcer.   Appendicitis.   Kidney (renal) stones.   Kidney infection (pylonephritis).  HOME CARE INSTRUCTIONS   For mild pain:   Do not have sexual intercourse or put anything in your vagina until your symptoms go away completely.   Get plenty of rest until your pain improves. If your pain does not improve in 1 hour, call  your caregiver.   Drink clear fluids if you feel nauseous. Avoid solid food as long as you are uncomfortable or nauseous.   Only take medicine as directed by your caregiver.   Keep all follow-up appointments with your caregiver.  SEEK IMMEDIATE MEDICAL CARE IF:   You are bleeding, leaking fluid, or passing tissue from the vagina.   You have increasing pain or cramping.   You have persistent vomiting.   You have painful or bloody urination.   You have a fever.   You notice a decrease in your baby's movements.   You have extreme weakness or feel faint.   You have shortness of breath, with or without abdominal pain.   You develop a severe headache with abdominal pain.   You have abnormal vaginal discharge with abdominal pain.   You have persistent diarrhea.   You have abdominal pain that continues even after rest, or gets worse.  MAKE SURE YOU:    Understand these instructions.   Will watch your condition.   Will get help right away if you are not doing well or get worse.  Document Released: 02/11/2005 Document Revised: 01/31/2011 Document Reviewed: 09/07/2010  [employer] Patient Information 2012 [employer].

## 2011-05-31 NOTE — MAU Note (Signed)
Patient states she woke up at 0300 with vomiting and abdominal pain. Denies any bleeding or leaking.

## 2011-05-31 NOTE — MAU Note (Signed)
Fleets enema sent home with pt per Pamelia Hoit, CNM order. Pt states more comfortable to use at home.

## 2011-05-31 NOTE — MAU Note (Signed)
Pamelia Hoit CNM  at bedside with pt

## 2011-10-06 ENCOUNTER — Inpatient Hospital Stay (HOSPITAL_COMMUNITY)
Admission: AD | Admit: 2011-10-06 | Discharge: 2011-10-08 | DRG: 775 | Disposition: A | Discharge status: Home / Self Care | Payer: Medicaid Other | Source: Ambulatory Visit | Attending: Obstetrics and Gynecology | Admitting: Obstetrics and Gynecology

## 2011-10-06 ENCOUNTER — Encounter (HOSPITAL_COMMUNITY): Payer: Self-pay | Admitting: *Deleted

## 2011-10-06 ENCOUNTER — Encounter (HOSPITAL_COMMUNITY): Payer: Self-pay | Admitting: Anesthesiology

## 2011-10-06 ENCOUNTER — Inpatient Hospital Stay (HOSPITAL_COMMUNITY)
Admission: AD | Admit: 2011-10-06 | Discharge: 2011-10-06 | Disposition: A | Discharge status: Home / Self Care | Payer: Medicaid Other | Source: Ambulatory Visit | Attending: Obstetrics and Gynecology | Admitting: Obstetrics and Gynecology

## 2011-10-06 ENCOUNTER — Inpatient Hospital Stay (HOSPITAL_COMMUNITY): Payer: Medicaid Other | Admitting: Anesthesiology

## 2011-10-06 DIAGNOSIS — Z3490 Encounter for supervision of normal pregnancy, unspecified, unspecified trimester: Secondary | ICD-10-CM

## 2011-10-06 DIAGNOSIS — O479 False labor, unspecified: Secondary | ICD-10-CM | POA: Insufficient documentation

## 2011-10-06 DIAGNOSIS — Z22330 Carrier of Group B streptococcus: Secondary | ICD-10-CM

## 2011-10-06 DIAGNOSIS — O99892 Other specified diseases and conditions complicating childbirth: Secondary | ICD-10-CM | POA: Diagnosis present

## 2011-10-06 LAB — CBC
HCT: 32.4 % — ABNORMAL LOW (ref 36.0–46.0)
Hemoglobin: 10.8 g/dL — ABNORMAL LOW (ref 12.0–15.0)
MCV: 89.3 fL (ref 78.0–100.0)
RBC: 3.63 MIL/uL — ABNORMAL LOW (ref 3.87–5.11)
RDW: 14.6 % (ref 11.5–15.5)
WBC: 8.5 10*3/uL (ref 4.0–10.5)

## 2011-10-06 LAB — URINALYSIS, DIPSTICK ONLY
Bilirubin Urine: NEGATIVE
Glucose, UA: NEGATIVE mg/dL
Ketones, ur: NEGATIVE mg/dL
Specific Gravity, Urine: >1.03 — ABNORMAL HIGH (ref 1.005–1.030)
pH: 5.5 (ref 5.0–8.0)

## 2011-10-06 LAB — TYPE AND SCREEN
ABO/RH(D): B POS
Antibody Screen: NEGATIVE

## 2011-10-06 LAB — OB RESULTS CONSOLE GBS: GBS: POSITIVE

## 2011-10-06 MED ORDER — LACTATED RINGERS IV SOLN
INTRAVENOUS | Status: DC
  Administered 2011-10-06 – 2011-10-07 (×2): via INTRAVENOUS

## 2011-10-06 MED ORDER — OXYTOCIN BOLUS FROM INFUSION
250.0000 mL | Freq: Once | INTRAVENOUS | Status: DC
  Filled 2011-10-06: qty 500

## 2011-10-06 MED ORDER — LACTATED RINGERS IV SOLN
500.0000 mL | INTRAVENOUS | Status: DC | PRN
  Administered 2011-10-07: 1000 mL via INTRAVENOUS

## 2011-10-06 MED ORDER — EPHEDRINE 5 MG/ML INJ
10.0000 mg | INTRAVENOUS | Status: DC | PRN
  Filled 2011-10-06: qty 4

## 2011-10-06 MED ORDER — LIDOCAINE HCL (PF) 1 % IJ SOLN
INTRAMUSCULAR | Status: DC | PRN
  Administered 2011-10-06 (×2): 5 mL

## 2011-10-06 MED ORDER — CITRIC ACID-SODIUM CITRATE 334-500 MG/5ML PO SOLN
30.0000 mL | ORAL | Status: DC | PRN
  Administered 2011-10-06 – 2011-10-07 (×2): 30 mL via ORAL
  Filled 2011-10-06 (×2): qty 15

## 2011-10-06 MED ORDER — OXYTOCIN 40 UNITS IN LACTATED RINGERS INFUSION - SIMPLE MED
1.0000 m[IU]/min | INTRAVENOUS | Status: DC
  Administered 2011-10-07: 1 m[IU]/min via INTRAVENOUS
  Filled 2011-10-06: qty 1000

## 2011-10-06 MED ORDER — PENICILLIN G POTASSIUM 5000000 UNITS IJ SOLR
2.5000 10*6.[IU] | INTRAMUSCULAR | Status: DC
  Administered 2011-10-07 (×2): 2.5 10*6.[IU] via INTRAVENOUS
  Filled 2011-10-06 (×5): qty 2.5

## 2011-10-06 MED ORDER — OXYCODONE-ACETAMINOPHEN 5-325 MG PO TABS: 1.0000 | ORAL_TABLET | ORAL | Status: DC | PRN

## 2011-10-06 MED ORDER — PHENYLEPHRINE 40 MCG/ML (10ML) SYRINGE FOR IV PUSH (FOR BLOOD PRESSURE SUPPORT)
80.0000 ug | PREFILLED_SYRINGE | INTRAVENOUS | Status: DC | PRN
  Filled 2011-10-06: qty 5

## 2011-10-06 MED ORDER — ZOLPIDEM TARTRATE 5 MG PO TABS: 5.0000 mg | ORAL_TABLET | Freq: Every evening | ORAL | Status: DC | PRN

## 2011-10-06 MED ORDER — TERBUTALINE SULFATE 1 MG/ML IJ SOLN: 0.2500 mg | Freq: Once | INTRAMUSCULAR | Status: AC | PRN

## 2011-10-06 MED ORDER — EPHEDRINE 5 MG/ML INJ: 10.0000 mg | INTRAVENOUS | Status: DC | PRN

## 2011-10-06 MED ORDER — PHENYLEPHRINE 40 MCG/ML (10ML) SYRINGE FOR IV PUSH (FOR BLOOD PRESSURE SUPPORT): 80.0000 ug | PREFILLED_SYRINGE | INTRAVENOUS | Status: DC | PRN

## 2011-10-06 MED ORDER — DIPHENHYDRAMINE HCL 50 MG/ML IJ SOLN: 12.5000 mg | INTRAMUSCULAR | Status: DC | PRN

## 2011-10-06 MED ORDER — LIDOCAINE HCL (PF) 1 % IJ SOLN
30.0000 mL | INTRAMUSCULAR | Status: DC | PRN
  Filled 2011-10-06: qty 30

## 2011-10-06 MED ORDER — IBUPROFEN 600 MG PO TABS
600.0000 mg | ORAL_TABLET | Freq: Four times a day (QID) | ORAL | Status: DC | PRN
  Administered 2011-10-07: 600 mg via ORAL
  Filled 2011-10-06: qty 1

## 2011-10-06 MED ORDER — FENTANYL 2.5 MCG/ML BUPIVACAINE 1/10 % EPIDURAL INFUSION (WH - ANES)
14.0000 mL/h | INTRAMUSCULAR | Status: DC
  Administered 2011-10-06 – 2011-10-07 (×3): 14 mL/h via EPIDURAL
  Filled 2011-10-06 (×3): qty 60

## 2011-10-06 MED ORDER — FLEET ENEMA 7-19 GM/118ML RE ENEM: 1.0000 | ENEMA | RECTAL | Status: DC | PRN

## 2011-10-06 MED ORDER — LACTATED RINGERS IV SOLN
500.0000 mL | Freq: Once | INTRAVENOUS | Status: AC
  Administered 2011-10-06: 1000 mL via INTRAVENOUS

## 2011-10-06 MED ORDER — PENICILLIN G POTASSIUM 5000000 UNITS IJ SOLR
5.0000 10*6.[IU] | Freq: Once | INTRAVENOUS | Status: AC
  Administered 2011-10-06: 5 10*6.[IU] via INTRAVENOUS
  Filled 2011-10-06: qty 5

## 2011-10-06 MED ORDER — ACETAMINOPHEN 325 MG PO TABS: 650.0000 mg | ORAL_TABLET | ORAL | Status: DC | PRN

## 2011-10-06 MED ORDER — OXYTOCIN 40 UNITS IN LACTATED RINGERS INFUSION - SIMPLE MED: 62.5000 mL/h | Freq: Once | INTRAVENOUS | Status: DC

## 2011-10-06 MED ORDER — ONDANSETRON HCL 4 MG/2ML IJ SOLN: 4.0000 mg | Freq: Four times a day (QID) | INTRAMUSCULAR | Status: DC | PRN

## 2011-10-06 NOTE — Progress Notes (Signed)
Called dr Tenny Craw, informed of sve, srom, fhr, uc pattern, order request for pitocin, dr Tenny Craw would like to wait on starting pitocin at this time.

## 2011-10-06 NOTE — Anesthesia Procedure Notes (Signed)
Epidural Patient location during procedure: OB Start time: 10/06/2011 8:40 PM  Staffing Anesthesiologist: Brayton Caves R Performed by: anesthesiologist   Preanesthetic Checklist Completed: patient identified, site marked, surgical consent, pre-op evaluation, timeout performed, IV checked, risks and benefits discussed and monitors and equipment checked  Epidural Patient position: sitting Prep: site prepped and draped and DuraPrep Patient monitoring: continuous pulse ox and blood pressure Approach: midline Injection technique: LOR air and LOR saline  Needle:  Needle type: Tuohy  Needle gauge: 17 G Needle length: 9 cm Needle insertion depth: 5 cm cm Catheter type: closed end flexible Catheter size: 19 Gauge Catheter at skin depth: 10 cm Test dose: negative  Assessment Events: blood not aspirated, injection not painful, no injection resistance, negative IV test and no paresthesia  Additional Notes Patient identified.  Risk benefits discussed including failed block, incomplete pain control, headache, nerve damage, paralysis, blood pressure changes, nausea, vomiting, reactions to medication both toxic or allergic, and postpartum back pain.  Patient expressed understanding and wished to proceed.  All questions were answered.  Sterile technique used throughout procedure and epidural site dressed with sterile barrier dressing. No paresthesia or other complications noted.The patient did not experience any signs of intravascular injection such as tinnitus or metallic taste in mouth nor signs of intrathecal spread such as rapid motor block. Please see nursing notes for vital signs.

## 2011-10-06 NOTE — MAU Note (Signed)
Contractions worse closer together than before.
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
## 2011-10-06 NOTE — MAU Note (Signed)
Pt presents with increase in contractions that have gotten worse over the last 1 1/2 hours.  Pt with bloody show from SVE's this AM.  Pt was seen this AM in MAU with cervical change from 1-2 cm through 3:30pm

## 2011-10-06 NOTE — MAU Note (Signed)
Pt reports having ctx q 8-9in all morning long and noticed some bleeding as well. Reports fetal movement less than normal.

## 2011-10-06 NOTE — MAU Note (Signed)
Patient states ucs every 7 yesterday and last night. Patient was able to sleep last night. Then awoke this am every 8-9 minutes and then 5-6 minutes and then noticed bloody show on underwear.

## 2011-10-07 ENCOUNTER — Encounter (HOSPITAL_COMMUNITY): Payer: Self-pay | Admitting: *Deleted

## 2011-10-07 MED ORDER — PRENATAL MULTIVITAMIN CH
1.0000 | ORAL_TABLET | Freq: Every day | ORAL | Status: DC
  Administered 2011-10-07 – 2011-10-08 (×2): 1 via ORAL
  Filled 2011-10-07 (×2): qty 1

## 2011-10-07 MED ORDER — BENZOCAINE-MENTHOL 20-0.5 % EX AERO
1.0000 "application " | INHALATION_SPRAY | CUTANEOUS | Status: DC | PRN
  Administered 2011-10-07: 1 via TOPICAL
  Filled 2011-10-07: qty 56

## 2011-10-07 MED ORDER — ZOLPIDEM TARTRATE 5 MG PO TABS
5.0000 mg | ORAL_TABLET | Freq: Every evening | ORAL | Status: DC | PRN
Start: 2011-10-07 — End: 2011-10-08

## 2011-10-07 MED ORDER — WITCH HAZEL-GLYCERIN EX PADS: 1.0000 "application " | MEDICATED_PAD | CUTANEOUS | Status: DC | PRN

## 2011-10-07 MED ORDER — IBUPROFEN 600 MG PO TABS
600.0000 mg | ORAL_TABLET | Freq: Four times a day (QID) | ORAL | Status: DC
  Administered 2011-10-07 – 2011-10-08 (×4): 600 mg via ORAL
  Filled 2011-10-07 (×4): qty 1

## 2011-10-07 MED ORDER — ONDANSETRON HCL 4 MG PO TABS: 4.0000 mg | ORAL_TABLET | ORAL | Status: DC | PRN

## 2011-10-07 MED ORDER — DIBUCAINE 1 % RE OINT
1.0000 "application " | TOPICAL_OINTMENT | RECTAL | Status: DC | PRN
  Administered 2011-10-07: 1 via RECTAL
  Filled 2011-10-07: qty 28

## 2011-10-07 MED ORDER — MEASLES, MUMPS & RUBELLA VAC ~~LOC~~ INJ: 0.5000 mL | INJECTION | Freq: Once | SUBCUTANEOUS | Status: DC

## 2011-10-07 MED ORDER — ONDANSETRON HCL 4 MG/2ML IJ SOLN: 4.0000 mg | INTRAMUSCULAR | Status: DC | PRN

## 2011-10-07 MED ORDER — ESCITALOPRAM OXALATE 10 MG PO TABS
10.0000 mg | ORAL_TABLET | Freq: Every day | ORAL | Status: AC
  Administered 2011-10-07 – 2011-10-08 (×2): 10 mg via ORAL
  Filled 2011-10-07 (×2): qty 1

## 2011-10-07 MED ORDER — OXYCODONE-ACETAMINOPHEN 5-325 MG PO TABS
1.0000 | ORAL_TABLET | ORAL | Status: DC | PRN
  Administered 2011-10-07 – 2011-10-08 (×2): 1 via ORAL
  Filled 2011-10-07 (×2): qty 1

## 2011-10-07 MED ORDER — SIMETHICONE 80 MG PO CHEW
80.0000 mg | CHEWABLE_TABLET | ORAL | Status: DC | PRN
Start: 2011-10-07 — End: 2011-10-08
  Administered 2011-10-08: 80 mg via ORAL

## 2011-10-07 MED ORDER — TETANUS-DIPHTH-ACELL PERTUSSIS 5-2.5-18.5 LF-MCG/0.5 IM SUSP
0.5000 mL | Freq: Once | INTRAMUSCULAR | Status: AC
  Administered 2011-10-08: 0.5 mL via INTRAMUSCULAR
  Filled 2011-10-07: qty 0.5
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
## 2011-10-07 NOTE — Progress Notes (Signed)
Dr Tenny Craw called after viewing fhr tracing from home, orders placed to start pitocin.

## 2011-10-07 NOTE — Progress Notes (Signed)
Dr Tenny Craw discussed with pt risks and benefits of vacuum assisted delivery, pt verbalizes and understands, see dr delivery note

## 2011-10-07 NOTE — Anesthesia Postprocedure Evaluation (Signed)
  Anesthesia Post-op Note  Patient: [name]  Procedure(s) Performed: * No procedures listed *  Patient Location: Mother/Baby  Anesthesia Type: Epidural  Level of Consciousness: awake, alert  and oriented  Airway and Oxygen Therapy: Patient Spontanous Breathing  Post-op Pain: mild  Post-op Assessment: Patient's Cardiovascular Status Stable, Respiratory Function Stable, Patent Airway, No signs of Nausea or vomiting and Pain level controlled  Post-op Vital Signs: stable  Complications: No apparent anesthesia complications

## 2011-10-08 LAB — CBC
HCT: 28.6 % — ABNORMAL LOW (ref 36.0–46.0)
MCH: 29.7 pg (ref 26.0–34.0)
MCHC: 32.9 g/dL (ref 30.0–36.0)
MCV: 90.2 fL (ref 78.0–100.0)
RDW: 14.8 % (ref 11.5–15.5)

## 2011-10-08 MED ORDER — MEASLES, MUMPS & RUBELLA VAC ~~LOC~~ INJ
0.5000 mL | INJECTION | Freq: Once | SUBCUTANEOUS | Status: AC
  Administered 2011-10-08: 0.5 mL via SUBCUTANEOUS
  Filled 2011-10-08: qty 0.5

## 2011-10-08 NOTE — Progress Notes (Signed)
UR chart review completed.  

## 2011-10-08 NOTE — Discharge Summary (Signed)
Obstetric Discharge Summary Reason for Admission: onset of labor Prenatal Procedures: ultrasound Intrapartum Procedures: vacuum Postpartum Procedures: none Complications-Operative and Postpartum: none Hemoglobin  Date Value Range Status  10/08/2011 9.4* 12.0 - 15.0 g/dL Final     HCT  Date Value Range Status  10/08/2011 28.6* 36.0 - 46.0 % Final    Physical Exam:  General: alert Lochia: appropriate Uterine Fundus: firm  Discharge Diagnoses: Term Pregnancy-delivered  Discharge Information: Date: 10/08/2011 Activity: pelvic rest Diet: routine Medications: PNV and Ibuprofen Condition: stable Instructions: refer to practice specific booklet Discharge to: home Follow-up Information    Follow up with Almon Hercules., MD.   [facility] information:   [address] [place] [address] [phone]          Newborn Data: Live born female  Birth Weight: 6 lb 11 oz (3033 g) APGAR: 7, 9  Home with mother.  Velia Pamer D 10/08/2011, 8:46 AM

## 2012-01-15 ENCOUNTER — Inpatient Hospital Stay (HOSPITAL_COMMUNITY)
Admission: AD | Admit: 2012-01-15 | Discharge: 2012-01-15 | Disposition: A | Discharge status: Home / Self Care | Payer: Medicaid Other | Source: Ambulatory Visit | Attending: Obstetrics and Gynecology | Admitting: Obstetrics and Gynecology

## 2012-01-15 ENCOUNTER — Encounter (HOSPITAL_COMMUNITY): Payer: Self-pay | Admitting: Advanced Practice Midwife

## 2012-01-15 DIAGNOSIS — O9122 Nonpurulent mastitis associated with the puerperium: Secondary | ICD-10-CM | POA: Insufficient documentation

## 2012-01-15 MED ORDER — CEPHALEXIN 500 MG PO CAPS: 500.0000 mg | ORAL_CAPSULE | Freq: Four times a day (QID) | ORAL | Status: DC

## 2012-01-15 MED ORDER — FLUCONAZOLE 150 MG PO TABS: 150.0000 mg | ORAL_TABLET | Freq: Once | ORAL | Status: DC
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
## 2012-01-15 NOTE — MAU Note (Signed)
Started last night, tenderness in rt breast, general body aches, redness noted in the 1 o'clock position, dime sized circle inside another circle.

## 2012-05-13 ENCOUNTER — Encounter (HOSPITAL_COMMUNITY): Payer: Self-pay | Admitting: Emergency Medicine

## 2012-05-13 ENCOUNTER — Emergency Department (HOSPITAL_COMMUNITY)
Admission: EM | Admit: 2012-05-13 | Discharge: 2012-05-13 | Disposition: A | Discharge status: Home / Self Care | Payer: Medicaid Other | Attending: Emergency Medicine | Admitting: Emergency Medicine

## 2012-05-13 DIAGNOSIS — Z8619 Personal history of other infectious and parasitic diseases: Secondary | ICD-10-CM | POA: Insufficient documentation

## 2012-05-13 DIAGNOSIS — G43909 Migraine, unspecified, not intractable, without status migrainosus: Secondary | ICD-10-CM | POA: Insufficient documentation

## 2012-05-13 DIAGNOSIS — F3289 Other specified depressive episodes: Secondary | ICD-10-CM | POA: Insufficient documentation

## 2012-05-13 DIAGNOSIS — Z872 Personal history of diseases of the skin and subcutaneous tissue: Secondary | ICD-10-CM | POA: Insufficient documentation

## 2012-05-13 DIAGNOSIS — Z87891 Personal history of nicotine dependence: Secondary | ICD-10-CM | POA: Insufficient documentation

## 2012-05-13 DIAGNOSIS — R51 Headache: Secondary | ICD-10-CM

## 2012-05-13 DIAGNOSIS — F411 Generalized anxiety disorder: Secondary | ICD-10-CM | POA: Insufficient documentation

## 2012-05-13 DIAGNOSIS — Z79899 Other long term (current) drug therapy: Secondary | ICD-10-CM | POA: Insufficient documentation

## 2012-05-13 DIAGNOSIS — Z8679 Personal history of other diseases of the circulatory system: Secondary | ICD-10-CM | POA: Insufficient documentation

## 2012-05-13 DIAGNOSIS — F329 Major depressive disorder, single episode, unspecified: Secondary | ICD-10-CM | POA: Insufficient documentation

## 2012-05-13 DIAGNOSIS — Z8742 Personal history of other diseases of the female genital tract: Secondary | ICD-10-CM | POA: Insufficient documentation

## 2012-05-13 MED ORDER — BUTALBITAL-APAP-CAFFEINE 50-325-40 MG PO TABS: ORAL_TABLET | ORAL | Status: DC

## 2012-05-13 MED ORDER — BUTALBITAL-APAP-CAFFEINE 50-325-40 MG PO TABS
1.0000 | ORAL_TABLET | Freq: Once | ORAL | Status: AC
  Administered 2012-05-13: 1 via ORAL
  Filled 2012-05-13: qty 1

## 2012-05-13 NOTE — Progress Notes (Signed)
During [facility] ED 05/11/12 visit CM spoke with pt who confirms self pay [facility] resident with no pcp. CM discussed and provided written information for self pay pcps, importance of pcp for f/u care, www.needymeds.org, discounted pharmacies, MATCH program and other [place] resources such as financial assistance, DSS and  health department Reviewed Health connect number to assist with finding self pay provider close to pt's residence. Reviewed resources for [place] self pay pcps like [facility], family medicine at [address], [facility] family practice, general medical clinics, [facility] urgent care plus others, [facility] out patient [facility], housing, affordable care act/health reform (deadline 05/25/12) and other resources in [employer]. Pt voiced understanding and appreciation of resources provided  Pt reports she has applied for medicaid but has not been approved

## 2012-05-13 NOTE — ED Notes (Signed)
Pt complains of "migraine starting at 0530 this morning" Pt denies vomiting at this time, complains of left head pain and nausea.
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
## 2012-05-15 ENCOUNTER — Inpatient Hospital Stay (HOSPITAL_COMMUNITY)
Admission: AD | Admit: 2012-05-15 | Discharge: 2012-05-15 | Discharge status: Against Medical Advice | Payer: Medicaid Other | Source: Ambulatory Visit | Attending: Obstetrics and Gynecology | Admitting: Obstetrics and Gynecology

## 2012-05-15 DIAGNOSIS — R1031 Right lower quadrant pain: Secondary | ICD-10-CM | POA: Insufficient documentation

## 2012-05-15 LAB — URINALYSIS, ROUTINE W REFLEX MICROSCOPIC
Bilirubin Urine: NEGATIVE
Ketones, ur: NEGATIVE mg/dL
Specific Gravity, Urine: 1.025 (ref 1.005–1.030)
Urobilinogen, UA: 0.2 mg/dL (ref 0.0–1.0)

## 2012-05-15 LAB — URINE MICROSCOPIC-ADD ON

## 2012-05-15 NOTE — MAU Note (Signed)
Pt states she has to go to get her son, was unable to get in contact with her spouse. Will return when she can.

## 2012-05-15 NOTE — MAU Note (Signed)
Patient states she has been unusual abdominal discomfort since having a baby 7 months ago. Right lower abdominal pain started a few days ago and getting worse. Denies bleeding, vomiting or fever.

## 2012-06-09 ENCOUNTER — Encounter (HOSPITAL_COMMUNITY): Payer: Self-pay

## 2012-06-09 ENCOUNTER — Emergency Department (HOSPITAL_COMMUNITY)
Admission: EM | Admit: 2012-06-09 | Discharge: 2012-06-09 | Disposition: A | Discharge status: Home / Self Care | Payer: Medicaid Other | Attending: Emergency Medicine | Admitting: Emergency Medicine

## 2012-06-09 ENCOUNTER — Emergency Department (HOSPITAL_COMMUNITY): Payer: Medicaid Other

## 2012-06-09 DIAGNOSIS — M546 Pain in thoracic spine: Secondary | ICD-10-CM | POA: Insufficient documentation

## 2012-06-09 DIAGNOSIS — Z8659 Personal history of other mental and behavioral disorders: Secondary | ICD-10-CM | POA: Insufficient documentation

## 2012-06-09 DIAGNOSIS — Z79899 Other long term (current) drug therapy: Secondary | ICD-10-CM | POA: Insufficient documentation

## 2012-06-09 DIAGNOSIS — Z87891 Personal history of nicotine dependence: Secondary | ICD-10-CM | POA: Insufficient documentation

## 2012-06-09 DIAGNOSIS — Z872 Personal history of diseases of the skin and subcutaneous tissue: Secondary | ICD-10-CM | POA: Insufficient documentation

## 2012-06-09 DIAGNOSIS — G8929 Other chronic pain: Secondary | ICD-10-CM | POA: Insufficient documentation

## 2012-06-09 DIAGNOSIS — Z8619 Personal history of other infectious and parasitic diseases: Secondary | ICD-10-CM | POA: Insufficient documentation

## 2012-06-09 MED ORDER — TRAMADOL HCL 50 MG PO TABS: 50.0000 mg | ORAL_TABLET | Freq: Four times a day (QID) | ORAL | Status: DC | PRN

## 2012-06-09 NOTE — ED Notes (Addendum)
Mid back pain x 2-3 months, "on the spine where my bra strap is."  Pt can't think of anything she may have done to injure it. States it is also tender to touch.  Pt has an 37month old and carries the carseat or the child around.
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
## 2012-06-10 NOTE — ED Provider Notes (Signed)
Medical screening examination/treatment/procedure(s) were performed by non-physician practitioner and as supervising physician I was immediately available for consultation/collaboration.   Nelia Shi, MD 06/10/12 1037

## 2012-07-24 ENCOUNTER — Inpatient Hospital Stay (HOSPITAL_COMMUNITY)
Admission: AD | Admit: 2012-07-24 | Discharge: 2012-07-24 | Disposition: A | Discharge status: Home / Self Care | Payer: Medicaid Other | Source: Ambulatory Visit | Attending: Obstetrics and Gynecology | Admitting: Obstetrics and Gynecology

## 2012-07-24 ENCOUNTER — Encounter (HOSPITAL_COMMUNITY): Payer: Self-pay

## 2012-07-24 DIAGNOSIS — N949 Unspecified condition associated with female genital organs and menstrual cycle: Secondary | ICD-10-CM | POA: Insufficient documentation

## 2012-07-24 DIAGNOSIS — L293 Anogenital pruritus, unspecified: Secondary | ICD-10-CM | POA: Insufficient documentation

## 2012-07-24 DIAGNOSIS — B3731 Acute candidiasis of vulva and vagina: Secondary | ICD-10-CM | POA: Insufficient documentation

## 2012-07-24 DIAGNOSIS — B373 Candidiasis of vulva and vagina: Secondary | ICD-10-CM

## 2012-07-24 LAB — URINALYSIS, ROUTINE W REFLEX MICROSCOPIC
Glucose, UA: NEGATIVE mg/dL
Hgb urine dipstick: NEGATIVE
Protein, ur: NEGATIVE mg/dL
Specific Gravity, Urine: 1.025 (ref 1.005–1.030)

## 2012-07-24 LAB — WET PREP, GENITAL: Yeast Wet Prep HPF POC: NONE SEEN

## 2012-07-24 LAB — POCT PREGNANCY, URINE: Preg Test, Ur: NEGATIVE

## 2012-07-24 MED ORDER — FLUCONAZOLE 150 MG PO TABS: 150.0000 mg | ORAL_TABLET | Freq: Once | ORAL | Status: DC

## 2012-07-24 NOTE — MAU Note (Signed)
Discharge noted for 1 week.
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
## 2012-07-24 NOTE — MAU Note (Signed)
Pt states is here for vaginal discharge that is thick. Notes itching and burning. No pain.

## 2012-08-23 ENCOUNTER — Emergency Department (HOSPITAL_COMMUNITY)
Admission: EM | Admit: 2012-08-23 | Discharge: 2012-08-23 | Disposition: A | Discharge status: Home / Self Care | Payer: Medicaid Other | Attending: Emergency Medicine | Admitting: Emergency Medicine

## 2012-08-23 ENCOUNTER — Encounter (HOSPITAL_COMMUNITY): Payer: Self-pay | Admitting: Emergency Medicine

## 2012-08-23 DIAGNOSIS — Z79899 Other long term (current) drug therapy: Secondary | ICD-10-CM | POA: Insufficient documentation

## 2012-08-23 DIAGNOSIS — Z87891 Personal history of nicotine dependence: Secondary | ICD-10-CM | POA: Insufficient documentation

## 2012-08-23 DIAGNOSIS — J029 Acute pharyngitis, unspecified: Secondary | ICD-10-CM | POA: Insufficient documentation

## 2012-08-23 DIAGNOSIS — Z8659 Personal history of other mental and behavioral disorders: Secondary | ICD-10-CM | POA: Insufficient documentation

## 2012-08-23 DIAGNOSIS — R059 Cough, unspecified: Secondary | ICD-10-CM | POA: Insufficient documentation

## 2012-08-23 DIAGNOSIS — R05 Cough: Secondary | ICD-10-CM | POA: Insufficient documentation

## 2012-08-23 DIAGNOSIS — J3489 Other specified disorders of nose and nasal sinuses: Secondary | ICD-10-CM | POA: Insufficient documentation

## 2012-08-23 DIAGNOSIS — Z872 Personal history of diseases of the skin and subcutaneous tissue: Secondary | ICD-10-CM | POA: Insufficient documentation

## 2012-08-23 DIAGNOSIS — H6690 Otitis media, unspecified, unspecified ear: Secondary | ICD-10-CM | POA: Insufficient documentation

## 2012-08-23 DIAGNOSIS — Z8619 Personal history of other infectious and parasitic diseases: Secondary | ICD-10-CM | POA: Insufficient documentation

## 2012-08-23 MED ORDER — AMOXICILLIN 500 MG PO CAPS: 1000.0000 mg | ORAL_CAPSULE | Freq: Three times a day (TID) | ORAL | Status: DC

## 2012-08-23 NOTE — ED Notes (Signed)
Pt c/o bilat ear pain, R more than left. Pt recently tx for bronchitis and back pain.

## 2012-08-23 NOTE — ED Provider Notes (Signed)
Medical screening examination/treatment/procedure(s) were performed by non-physician practitioner and as supervising physician I was immediately available for consultation/collaboration. Devoria Albe, MD, Armando Gang   Ward Givens, MD 08/23/12 [phone]
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
## 2012-09-10 DIAGNOSIS — G43709 Chronic migraine without aura, not intractable, without status migrainosus: Secondary | ICD-10-CM | POA: Insufficient documentation

## 2012-09-10 DIAGNOSIS — IMO0002 Reserved for concepts with insufficient information to code with codable children: Secondary | ICD-10-CM | POA: Insufficient documentation

## 2012-09-30 DIAGNOSIS — M546 Pain in thoracic spine: Secondary | ICD-10-CM | POA: Insufficient documentation

## 2013-01-01 ENCOUNTER — Other Ambulatory Visit: Payer: Self-pay | Admitting: Obstetrics and Gynecology

## 2013-04-05 ENCOUNTER — Other Ambulatory Visit: Payer: Self-pay | Admitting: Otolaryngology

## 2013-04-05 DIAGNOSIS — H9190 Unspecified hearing loss, unspecified ear: Secondary | ICD-10-CM

## 2013-04-07 ENCOUNTER — Other Ambulatory Visit: Payer: Self-pay | Admitting: Otolaryngology

## 2013-04-12 ENCOUNTER — Other Ambulatory Visit: Payer: Medicaid Other

## 2013-04-15 ENCOUNTER — Other Ambulatory Visit: Payer: Medicaid Other

## 2013-04-30 ENCOUNTER — Ambulatory Visit
Admission: RE | Admit: 2013-04-30 | Discharge: 2013-04-30 | Disposition: A | Discharge status: Home / Self Care | Payer: Medicaid Other | Source: Ambulatory Visit | Attending: Otolaryngology | Admitting: Otolaryngology

## 2013-04-30 DIAGNOSIS — H9190 Unspecified hearing loss, unspecified ear: Secondary | ICD-10-CM

## 2013-04-30 MED ORDER — GADOBENATE DIMEGLUMINE 529 MG/ML IV SOLN
12.0000 mL | Freq: Once | INTRAVENOUS | Status: AC | PRN
  Administered 2013-04-30: 12 mL via INTRAVENOUS

## 2013-06-02 ENCOUNTER — Ambulatory Visit (INDEPENDENT_AMBULATORY_CARE_PROVIDER_SITE_OTHER): Payer: Medicaid Other | Admitting: Neurology

## 2013-06-02 ENCOUNTER — Encounter (INDEPENDENT_AMBULATORY_CARE_PROVIDER_SITE_OTHER): Payer: Self-pay

## 2013-06-02 ENCOUNTER — Encounter: Payer: Self-pay | Admitting: Neurology

## 2013-06-02 VITALS — BP 113/81 | HR 72 | Ht 63.0 in | Wt 139.0 lb

## 2013-06-02 DIAGNOSIS — R519 Headache, unspecified: Secondary | ICD-10-CM

## 2013-06-02 DIAGNOSIS — R51 Headache: Secondary | ICD-10-CM

## 2013-06-02 DIAGNOSIS — G43009 Migraine without aura, not intractable, without status migrainosus: Secondary | ICD-10-CM

## 2013-06-02 MED ORDER — BUTALBITAL-APAP-CAFFEINE 50-325-40 MG PO TABS: 1.0000 | ORAL_TABLET | Freq: Four times a day (QID) | ORAL | Status: DC | PRN
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
## 2013-06-02 NOTE — Patient Instructions (Signed)
Overall you are doing fairly well but I do want to suggest a few things today:   Remember to drink plenty of fluid, eat healthy meals and do not skip any meals. Try to eat protein with a every meal and eat a healthy snack such as fruit or nuts in between meals. Try to keep a regular sleep-wake schedule and try to exercise daily, particularly in the form of walking, 20-30 minutes a day, if you can.   As far as your medications are concerned, I would like to suggest the following: 1)re-start the fioricet but limit to no more than 2 times in a week. If using more frequently then please call our office and we will likely suggest an alternative medication.   Please call in 4 weeks to update me on how you are doing  Follow up as needed. Please call us with any interim questions, concerns, problems, updates or refill requests.   My clinical assistant and will answer any of your questions and relay your messages to me and also relay most of my messages to you.   Our phone number is [phone]. We also have an after hours call service for urgent matters and there is a physician on-call for urgent questions. For any emergencies you know to call 911 or go to the nearest emergency room

## 2013-06-04 ENCOUNTER — Inpatient Hospital Stay (HOSPITAL_COMMUNITY)
Admission: AD | Admit: 2013-06-04 | Discharge: 2013-06-04 | Disposition: A | Discharge status: Home / Self Care | Payer: Medicaid Other | Source: Ambulatory Visit | Attending: Obstetrics and Gynecology | Admitting: Obstetrics and Gynecology

## 2013-06-04 ENCOUNTER — Encounter (HOSPITAL_COMMUNITY): Payer: Self-pay | Admitting: *Deleted

## 2013-06-04 DIAGNOSIS — L293 Anogenital pruritus, unspecified: Secondary | ICD-10-CM | POA: Insufficient documentation

## 2013-06-04 DIAGNOSIS — N898 Other specified noninflammatory disorders of vagina: Secondary | ICD-10-CM

## 2013-06-04 DIAGNOSIS — R109 Unspecified abdominal pain: Secondary | ICD-10-CM | POA: Insufficient documentation

## 2013-06-04 DIAGNOSIS — Z87891 Personal history of nicotine dependence: Secondary | ICD-10-CM | POA: Insufficient documentation

## 2013-06-04 LAB — URINALYSIS, ROUTINE W REFLEX MICROSCOPIC
Bilirubin Urine: NEGATIVE
Glucose, UA: NEGATIVE mg/dL
Hgb urine dipstick: NEGATIVE
Ketones, ur: NEGATIVE mg/dL
LEUKOCYTES UA: NEGATIVE
Nitrite: NEGATIVE
Protein, ur: NEGATIVE mg/dL
Specific Gravity, Urine: >1.03 — ABNORMAL HIGH (ref 1.005–1.030)
UROBILINOGEN UA: 0.2 mg/dL (ref 0.0–1.0)
pH: 5.5 (ref 5.0–8.0)

## 2013-06-04 LAB — WET PREP, GENITAL
Clue Cells Wet Prep HPF POC: NONE SEEN
Trich, Wet Prep: NONE SEEN
Yeast Wet Prep HPF POC: NONE SEEN

## 2013-06-04 LAB — POCT PREGNANCY, URINE: Preg Test, Ur: NEGATIVE

## 2013-06-04 LAB — HIV ANTIBODY (ROUTINE TESTING W REFLEX): HIV: NONREACTIVE

## 2013-06-04 NOTE — Discharge Instructions (Signed)
Pelvic Exam A pelvic (gynecologic) exam is an exam of a woman's outer and inner genitals and reproductive organs. At age 36, or before a woman starts to have sexual intercourse, she should have her first pelvic exam. Pelvic exams allow your caregiver to check on normal development and screen for health problems. These exams should be done regularly throughout a woman's life. Usually, a general physical exam is done first. An exam of the breasts is also done. At this visit, you can ask questions about your health, body, menstrual cycles, sex, and birth control methods. Your caregiver will also ask you questions about your health, family health, menstrual periods, immunizations, and if you are sexually active. The information shared between you and your caregiver is kept confidential. REASONS FOR A PELVIC EXAM  Annual exam and Pap test. A Pap test removes cells from the cervix gently with a spatula and a small brush. The cells are tested for infection, precancer, and cancer.  A Pap test is done to screen for cervical cancer.  The first Pap test should be done at age 21.  Between ages 21 and 29, Pap tests are repeated every 2 years.  Beginning at age 30, you are advised to have a Pap test every 3 years as long as your past 3 Pap tests have been normal.  Some women have medical problems that increase the chance of getting cervical cancer. Talk to your caregiver about these problems. It is especially important to talk to your caregiver if a new problem develops soon after your last Pap test. In these cases, your caregiver may recommend more frequent screening and Pap tests.  The above recommendations are the same for women who have or have not gotten the vaccine for HPV (Human Papillomavirus).  If you had a hysterectomy for a problem that was not cancer or a condition that could lead to cancer, then you no longer need Pap tests. However, even if you no longer need a Pap test, a regular exam is a good  idea to make sure no other problems are starting.   If you are between ages 65 and 70, and you have had normal Pap tests going back 10 years, you no longer need Pap tests. However, even if you no longer need a Pap test, a regular exam is a good idea to make sure no other problems are starting.   If you have had past treatment for cervical cancer or a condition that could lead to cancer, you need Pap tests and screening for cancer for at least 20 years after your treatment.  If Pap tests have been discontinued, risk factors (such as a new sexual partner) need to be re-assessed to determine if screening should be resumed.  Some women may need screenings more often if they are at high risk for cervical cancer.  Make sure your female organs are normal and functioning correctly.  Evaluate a mass or other symptoms that suggest a reproductive system cancer.  Explore why you are not able to get pregnant (infertility).  Find a cause for vaginal discharge, itching, or burning.  Get certain types of birth control or start hormone therapy.  Look for causes of urinary incontinence or sexual problems.  Look for signs of sexually transmitted infection (STI).  Follow the progression of labor.  Determine if pregnancy is present or how far advanced the pregnancy is.  You have severe cramps during your menstrual period.  You have pain during sexual intercourse.  You have abnormal   menstrual periods.  You have no menstrual period by the age of 16. PROCEDURE   A pelvic exam is usually painless but may cause mild discomfort.  In unusual circumstances or in young girls, medicines may be used for comfort. A pelvic exam is not done routinely before a girl is sexually active. Special circumstances such as rape, trauma, or medical problems may require an exam.  You will remove all your clothes and will be given a gown. Usually, there is a nurse in the room during the exam and you can have someone  from your family with you also.  The general physical exam will be done first.  Before the pelvic exam starts, the woman lies down on her back on a special table. She puts the heels of her feet into foot rests (stirrups) with her legs apart. A gown, cloth, or paper drape is usually placed over her belly (abdomen) and legs. First, the caregiver checks the normal arrangement of body parts of the outer genitals. This includes the clitoris, vaginal opening, hymen, labia, and the perineal area between the vagina and rectum. The labia are the skin folds surrounding the vaginal opening. The tube that carries urine (urethra) is also examined.  An internal exam is done next. First, the caregiver inserts an instrument called a speculum into the vagina. The speculum has lubricant on it. The speculum helps hold the vaginal walls apart. The caregiver can then examine the vagina and cervix, which is the opening to the womb (uterus). Cultures of any discharge may be taken to check for an infection. A Pap test may be done.  After the internal exam is done, the speculum is removed. The caregiver uses latex gloves with a lubricant on the fingers to gently press against various pelvic organs from inside the vagina while the other hand is on the lower belly. The caregiver will note any tenderness or abnormalities.  If a pelvic exam is done on a woman who is thought to be in labor, her caregiver can check on the baby and how far her cervix has opened.  Following the exam, you will get dressed and can speak with your caregiver.  Ask your caregiver when and how often you should return for future visits. Finding out the results of your test Ask when your test results will be ready. Make sure you get your test results. TO HAVE A HEALTHY LIFESTYLE:  Follow your caregiver's advice regarding follow-up and future visits.  Get the necessary immunizations according to your age and any traveling you may do.  Eat a balanced,  nourishing diet.  Get plenty of rest and sleep.  Exercise regularly.  Maintain a healthy weight.  Do not smoke or take illegal drugs.  Drink alcohol in moderation or not at all.  If you are sexually active, use some form of birth control if you do not plan to get pregnant.  If you are sexually active, practice safe sex by using a condom to protect against sexually transmitted disease (STD).  Get help or counseling if you have emotional problems. Document Released: 05/04/2002 Document Revised: 05/06/2011 Document Reviewed: 05/10/2009 [employer] Patient Information 2014 [employer].  
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
## 2013-06-04 NOTE — MAU Note (Signed)
Patient states she has had a vaginal discharge with irritation. Denies pain. Wants STI check.

## 2013-06-05 LAB — GC/CHLAMYDIA PROBE AMP
CT Probe RNA: NEGATIVE
GC Probe RNA: NEGATIVE

## 2013-07-24 ENCOUNTER — Encounter: Payer: Self-pay | Admitting: Neurology

## 2013-07-27 ENCOUNTER — Other Ambulatory Visit: Payer: Self-pay | Admitting: Neurology

## 2013-07-27 MED ORDER — SUMATRIPTAN SUCCINATE 25 MG PO TABS: 25.0000 mg | ORAL_TABLET | ORAL | Status: DC | PRN

## 2013-07-27 MED ORDER — MAGNESIUM OXIDE 400 MG PO TABS: 400.0000 mg | ORAL_TABLET | Freq: Every day | ORAL | Status: DC

## 2013-08-01 ENCOUNTER — Emergency Department (HOSPITAL_COMMUNITY)
Admission: EM | Admit: 2013-08-01 | Discharge: 2013-08-01 | Disposition: A | Discharge status: Home / Self Care | Payer: Medicaid Other | Attending: Emergency Medicine | Admitting: Emergency Medicine

## 2013-08-01 ENCOUNTER — Encounter (HOSPITAL_COMMUNITY): Payer: Self-pay | Admitting: Emergency Medicine

## 2013-08-01 DIAGNOSIS — F3289 Other specified depressive episodes: Secondary | ICD-10-CM | POA: Insufficient documentation

## 2013-08-01 DIAGNOSIS — F411 Generalized anxiety disorder: Secondary | ICD-10-CM | POA: Insufficient documentation

## 2013-08-01 DIAGNOSIS — Z8619 Personal history of other infectious and parasitic diseases: Secondary | ICD-10-CM | POA: Diagnosis not present

## 2013-08-01 DIAGNOSIS — R51 Headache: Secondary | ICD-10-CM | POA: Diagnosis present

## 2013-08-01 DIAGNOSIS — Z87891 Personal history of nicotine dependence: Secondary | ICD-10-CM | POA: Insufficient documentation

## 2013-08-01 DIAGNOSIS — Z79899 Other long term (current) drug therapy: Secondary | ICD-10-CM | POA: Insufficient documentation

## 2013-08-01 DIAGNOSIS — Z872 Personal history of diseases of the skin and subcutaneous tissue: Secondary | ICD-10-CM | POA: Insufficient documentation

## 2013-08-01 DIAGNOSIS — G43909 Migraine, unspecified, not intractable, without status migrainosus: Secondary | ICD-10-CM | POA: Diagnosis not present

## 2013-08-01 DIAGNOSIS — F329 Major depressive disorder, single episode, unspecified: Secondary | ICD-10-CM | POA: Insufficient documentation

## 2013-08-01 MED ORDER — DIPHENHYDRAMINE HCL 50 MG/ML IJ SOLN
12.5000 mg | Freq: Once | INTRAMUSCULAR | Status: AC
  Administered 2013-08-01: 12.5 mg via INTRAVENOUS
  Filled 2013-08-01: qty 1

## 2013-08-01 MED ORDER — DEXAMETHASONE SODIUM PHOSPHATE 10 MG/ML IJ SOLN
10.0000 mg | Freq: Once | INTRAMUSCULAR | Status: AC
  Administered 2013-08-01: 10 mg via INTRAVENOUS
  Filled 2013-08-01: qty 1

## 2013-08-01 MED ORDER — METOCLOPRAMIDE HCL 5 MG/ML IJ SOLN
10.0000 mg | Freq: Once | INTRAMUSCULAR | Status: AC
  Administered 2013-08-01: 10 mg via INTRAVENOUS
  Filled 2013-08-01: qty 2
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
## 2013-08-01 NOTE — ED Notes (Signed)
Pt alert and oriented x4. Respirations even and unlabored, bilateral symmetrical rise and fall of chest. Skin warm and dry. In no acute distress. Denies needs.   

## 2013-08-01 NOTE — ED Notes (Signed)
Pt with migraine since yesterday afternoon. Pt has hx of same and is out of meds.

## 2013-08-01 NOTE — Discharge Instructions (Signed)
Migraine Headache A migraine headache is an intense, throbbing pain on one or both sides of your head. A migraine can last for 30 minutes to several hours. CAUSES  The exact cause of a migraine headache is not always known. However, a migraine may be caused when nerves in the brain become irritated and release chemicals that cause inflammation. This causes pain. Certain things may also trigger migraines, such as:  Alcohol.  Smoking.  Stress.  Menstruation.  Aged cheeses.  Foods or drinks that contain nitrates, glutamate, aspartame, or tyramine.  Lack of sleep.  Chocolate.  Caffeine.  Hunger.  Physical exertion.  Fatigue.  Medicines used to treat chest pain (nitroglycerine), birth control pills, estrogen, and some blood pressure medicines. SIGNS AND SYMPTOMS  Pain on one or both sides of your head.  Pulsating or throbbing pain.  Severe pain that prevents daily activities.  Pain that is aggravated by any physical activity.  Nausea, vomiting, or both.  Dizziness.  Pain with exposure to bright lights, loud noises, or activity.  General sensitivity to bright lights, loud noises, or smells. Before you get a migraine, you may get warning signs that a migraine is coming (aura). An aura may include:  Seeing flashing lights.  Seeing bright spots, halos, or zig-zag lines.  Having tunnel vision or blurred vision.  Having feelings of numbness or tingling.  Having trouble talking.  Having muscle weakness. DIAGNOSIS  A migraine headache is often diagnosed based on:  Symptoms.  Physical exam.  A CT scan or MRI of your head. These imaging tests cannot diagnose migraines, but they can help rule out other causes of headaches. TREATMENT Medicines may be given for pain and nausea. Medicines can also be given to help prevent recurrent migraines.  HOME CARE INSTRUCTIONS  Only take over-the-counter or prescription medicines for pain or discomfort as directed by your  health care provider. The use of long-term narcotics is not recommended.  Lie down in a dark, quiet room when you have a migraine.  Keep a journal to find out what may trigger your migraine headaches. For example, write down:  What you eat and drink.  How much sleep you get.  Any change to your diet or medicines.  Limit alcohol consumption.  Quit smoking if you smoke.  Get 7 9 hours of sleep, or as recommended by your health care provider.  Limit stress.  Keep lights dim if bright lights bother you and make your migraines worse. SEEK IMMEDIATE MEDICAL CARE IF:   Your migraine becomes severe.  You have a fever.  You have a stiff neck.  You have vision loss.  You have muscular weakness or loss of muscle control.  You start losing your balance or have trouble walking.  You feel faint or pass out.  You have severe symptoms that are different from your first symptoms. MAKE SURE YOU:   Understand these instructions.  Will watch your condition.  Will get help right away if you are not doing well or get worse. Document Released: 02/11/2005 Document Revised: 12/02/2012 Document Reviewed: 10/19/2012 ExitCare Patient Information 2014 [employer].  

## 2013-08-01 NOTE — ED Provider Notes (Signed)
Medical screening examination/treatment/procedure(s) were performed by non-physician practitioner and as supervising physician I was immediately available for consultation/collaboration.   Linwood Dibbles, MD 08/01/13 2002

## 2013-08-20 ENCOUNTER — Other Ambulatory Visit: Payer: Self-pay

## 2013-08-20 MED ORDER — SUMATRIPTAN SUCCINATE 25 MG PO TABS: ORAL_TABLET | ORAL | Status: DC

## 2013-09-07 ENCOUNTER — Encounter: Payer: Self-pay | Admitting: Neurology

## 2013-09-08 ENCOUNTER — Other Ambulatory Visit: Payer: Self-pay | Admitting: Neurology

## 2013-09-08 MED ORDER — SUMATRIPTAN SUCCINATE 100 MG PO TABS: 100.0000 mg | ORAL_TABLET | Freq: Once | ORAL | Status: DC | PRN

## 2013-10-02 ENCOUNTER — Other Ambulatory Visit: Payer: Self-pay | Admitting: Neurology

## 2013-10-12 ENCOUNTER — Other Ambulatory Visit: Payer: Self-pay | Admitting: Neurology

## 2013-10-12 ENCOUNTER — Telehealth: Payer: Self-pay | Admitting: Neurology

## 2013-10-12 MED ORDER — TOPIRAMATE 25 MG PO CPSP: 25.0000 mg | ORAL_CAPSULE | Freq: Two times a day (BID) | ORAL | Status: DC

## 2013-10-12 MED ORDER — TOPIRAMATE 25 MG PO TABS: ORAL_TABLET | ORAL | Status: DC

## 2013-10-12 NOTE — Telephone Encounter (Signed)
Revonda StandardAllison from [facility] on [address] calling to ask whether the script for Topamax was for sprinkle capsules or for regular tablets, please return call and advise.

## 2013-10-12 NOTE — Telephone Encounter (Signed)
I called back.  Spoke with Revonda StandardAllison.  They are unable to get the capsules at this time, so they have to dispense tablets.  The total dose will remain the same.  New Rx has been sent.

## 2013-11-01 ENCOUNTER — Other Ambulatory Visit: Payer: Self-pay | Admitting: Neurology

## 2013-12-04 ENCOUNTER — Encounter: Payer: Self-pay | Admitting: Neurology

## 2013-12-06 ENCOUNTER — Encounter: Payer: Self-pay | Admitting: *Deleted

## 2013-12-07 ENCOUNTER — Other Ambulatory Visit: Payer: Self-pay | Admitting: Neurology

## 2013-12-09 ENCOUNTER — Other Ambulatory Visit: Payer: Self-pay | Admitting: Neurology

## 2013-12-09 ENCOUNTER — Encounter: Payer: Self-pay | Admitting: Neurology

## 2013-12-09 MED ORDER — SUMATRIPTAN SUCCINATE 100 MG PO TABS: 100.0000 mg | ORAL_TABLET | Freq: Two times a day (BID) | ORAL | Status: DC | PRN

## 2013-12-27 ENCOUNTER — Encounter (HOSPITAL_COMMUNITY): Payer: Self-pay | Admitting: Emergency Medicine

## 2014-01-10 ENCOUNTER — Inpatient Hospital Stay (HOSPITAL_COMMUNITY)
Admission: AD | Admit: 2014-01-10 | Discharge: 2014-01-10 | Discharge status: Against Medical Advice | Payer: Medicaid Other | Source: Ambulatory Visit | Attending: Obstetrics and Gynecology | Admitting: Obstetrics and Gynecology

## 2014-01-10 NOTE — MAU Note (Signed)
Patient is not in the lobby when called to triage.  

## 2014-01-12 ENCOUNTER — Other Ambulatory Visit: Payer: Self-pay | Admitting: Obstetrics and Gynecology

## 2014-01-13 LAB — CYTOLOGY - PAP

## 2014-02-11 ENCOUNTER — Other Ambulatory Visit: Payer: Self-pay

## 2014-02-11 MED ORDER — SUMATRIPTAN SUCCINATE 100 MG PO TABS: 100.0000 mg | ORAL_TABLET | ORAL | Status: DC | PRN

## 2014-03-31 ENCOUNTER — Other Ambulatory Visit: Payer: Self-pay | Admitting: Neurology

## 2014-04-14 ENCOUNTER — Other Ambulatory Visit: Payer: Self-pay

## 2014-04-17 ENCOUNTER — Encounter (HOSPITAL_COMMUNITY): Payer: Self-pay | Admitting: *Deleted

## 2014-04-17 ENCOUNTER — Inpatient Hospital Stay (HOSPITAL_COMMUNITY)
Admission: AD | Admit: 2014-04-17 | Discharge: 2014-04-17 | Disposition: A | Discharge status: Home / Self Care | Payer: Medicaid Other | Source: Ambulatory Visit | Attending: Obstetrics and Gynecology | Admitting: Obstetrics and Gynecology

## 2014-04-17 DIAGNOSIS — Z87891 Personal history of nicotine dependence: Secondary | ICD-10-CM | POA: Insufficient documentation

## 2014-04-17 DIAGNOSIS — K649 Unspecified hemorrhoids: Secondary | ICD-10-CM | POA: Insufficient documentation

## 2014-04-17 LAB — POCT PREGNANCY, URINE: Preg Test, Ur: NEGATIVE

## 2014-04-17 MED ORDER — LIDOCAINE-HYDROCORTISONE ACE 3-0.5 % RE CREA: 1.0000 | TOPICAL_CREAM | Freq: Two times a day (BID) | RECTAL | Status: DC

## 2014-04-17 NOTE — Discharge Instructions (Signed)
Hemorrhoids Hemorrhoids are puffy (swollen) veins around the rectum or anus. Hemorrhoids can cause pain, itching, bleeding, or irritation. HOME CARE  Eat foods with fiber, such as whole grains, beans, nuts, fruits, and vegetables. Ask your doctor about taking products with added fiber in them (fibersupplements).  Drink enough fluid to keep your pee (urine) clear or pale yellow.  Exercise often.  Go to the bathroom when you have the urge to poop. Do not wait.  Avoid straining to poop (bowel movement).  Keep the butt area dry and clean. Use wet toilet paper or moist paper towels.  Medicated creams and medicine inserted into the anus (anal suppository) may be used or applied as told.  Only take medicine as told by your doctor.  Take a warm water bath (sitz bath) for 15-20 minutes to ease pain. Do this 3-4 times a day.  Place ice packs on the area if it is tender or puffy. Use the ice packs between the warm water baths.  Put ice in a plastic bag.  Place a towel between your skin and the bag.  Leave the ice on for 15-20 minutes, 03-04 times a day.  Do not use a donut-shaped pillow or sit on the toilet for a long time. GET HELP RIGHT AWAY IF:   You have more pain that is not controlled by treatment or medicine.  You have bleeding that will not stop.  You have trouble or are unable to poop (bowel movement).  You have pain or puffiness outside the area of the hemorrhoids. MAKE SURE YOU:   Understand these instructions.  Will watch your condition.  Will get help right away if you are not doing well or get worse. Document Released: 11/21/2007 Document Revised: 01/29/2012 Document Reviewed: 12/24/2011 ExitCare Patient Information 2015 [employer]. This information is not intended to replace advice given to you by your health care provider. Make sure you discuss any questions you have with your health care provider.  

## 2014-04-17 NOTE — MAU Note (Signed)
Pt states here for hemorrhoids. Began flaring up Tuesday, and feels hard. Otherwise no other complaints.
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
## 2014-05-26 ENCOUNTER — Encounter (HOSPITAL_COMMUNITY): Payer: Self-pay

## 2014-05-26 ENCOUNTER — Emergency Department (HOSPITAL_COMMUNITY)
Admission: EM | Admit: 2014-05-26 | Discharge: 2014-05-26 | Disposition: A | Discharge status: Home / Self Care | Payer: Medicaid Other | Attending: Emergency Medicine | Admitting: Emergency Medicine

## 2014-05-26 DIAGNOSIS — Z872 Personal history of diseases of the skin and subcutaneous tissue: Secondary | ICD-10-CM | POA: Insufficient documentation

## 2014-05-26 DIAGNOSIS — J029 Acute pharyngitis, unspecified: Secondary | ICD-10-CM | POA: Insufficient documentation

## 2014-05-26 DIAGNOSIS — F329 Major depressive disorder, single episode, unspecified: Secondary | ICD-10-CM | POA: Insufficient documentation

## 2014-05-26 DIAGNOSIS — G43909 Migraine, unspecified, not intractable, without status migrainosus: Secondary | ICD-10-CM | POA: Diagnosis not present

## 2014-05-26 DIAGNOSIS — Z87891 Personal history of nicotine dependence: Secondary | ICD-10-CM | POA: Diagnosis not present

## 2014-05-26 DIAGNOSIS — Z8619 Personal history of other infectious and parasitic diseases: Secondary | ICD-10-CM | POA: Insufficient documentation

## 2014-05-26 DIAGNOSIS — F419 Anxiety disorder, unspecified: Secondary | ICD-10-CM | POA: Insufficient documentation

## 2014-05-26 HISTORY — DX: Migraine, unspecified, not intractable, without status migrainosus: G43.909

## 2014-05-26 LAB — RAPID STREP SCREEN (MED CTR MEBANE ONLY): Streptococcus, Group A Screen (Direct): NEGATIVE

## 2014-05-26 MED ORDER — PREDNISONE 20 MG PO TABS: 40.0000 mg | ORAL_TABLET | Freq: Every day | ORAL | Status: DC

## 2014-05-26 MED ORDER — HYDROCODONE-ACETAMINOPHEN 7.5-325 MG/15ML PO SOLN
10.0000 mL | Freq: Once | ORAL | Status: AC
  Administered 2014-05-26: 10 mL via ORAL
  Filled 2014-05-26: qty 15
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
## 2014-05-26 NOTE — ED Notes (Signed)
Pt present with NAD- c/o sore throat x 1 week. No other complaints. Denies N/V/D and fever. States "its below my throat."

## 2014-05-26 NOTE — Discharge Instructions (Signed)

## 2014-05-30 LAB — CULTURE, GROUP A STREP: Strep A Culture: NEGATIVE

## 2014-07-04 ENCOUNTER — Other Ambulatory Visit: Payer: Self-pay | Admitting: Neurology

## 2014-07-07 ENCOUNTER — Encounter (HOSPITAL_COMMUNITY): Payer: Self-pay

## 2014-07-07 ENCOUNTER — Emergency Department (HOSPITAL_COMMUNITY)
Admission: EM | Admit: 2014-07-07 | Discharge: 2014-07-07 | Disposition: A | Discharge status: Home / Self Care | Payer: Medicaid Other | Attending: Emergency Medicine | Admitting: Emergency Medicine

## 2014-07-07 DIAGNOSIS — F329 Major depressive disorder, single episode, unspecified: Secondary | ICD-10-CM | POA: Diagnosis not present

## 2014-07-07 DIAGNOSIS — Z872 Personal history of diseases of the skin and subcutaneous tissue: Secondary | ICD-10-CM | POA: Diagnosis not present

## 2014-07-07 DIAGNOSIS — F419 Anxiety disorder, unspecified: Secondary | ICD-10-CM | POA: Diagnosis not present

## 2014-07-07 DIAGNOSIS — G43909 Migraine, unspecified, not intractable, without status migrainosus: Secondary | ICD-10-CM | POA: Insufficient documentation

## 2014-07-07 DIAGNOSIS — Z87891 Personal history of nicotine dependence: Secondary | ICD-10-CM | POA: Insufficient documentation

## 2014-07-07 DIAGNOSIS — R519 Headache, unspecified: Secondary | ICD-10-CM

## 2014-07-07 DIAGNOSIS — Z79899 Other long term (current) drug therapy: Secondary | ICD-10-CM | POA: Diagnosis not present

## 2014-07-07 DIAGNOSIS — Z7952 Long term (current) use of systemic steroids: Secondary | ICD-10-CM | POA: Diagnosis not present

## 2014-07-07 DIAGNOSIS — Z793 Long term (current) use of hormonal contraceptives: Secondary | ICD-10-CM | POA: Insufficient documentation

## 2014-07-07 DIAGNOSIS — Z8619 Personal history of other infectious and parasitic diseases: Secondary | ICD-10-CM | POA: Insufficient documentation

## 2014-07-07 DIAGNOSIS — R51 Headache: Secondary | ICD-10-CM

## 2014-07-07 MED ORDER — PROCHLORPERAZINE EDISYLATE 5 MG/ML IJ SOLN
10.0000 mg | Freq: Once | INTRAMUSCULAR | Status: AC
  Administered 2014-07-07: 10 mg via INTRAVENOUS
  Filled 2014-07-07: qty 2

## 2014-07-07 MED ORDER — KETOROLAC TROMETHAMINE 30 MG/ML IJ SOLN
30.0000 mg | Freq: Once | INTRAMUSCULAR | Status: AC
  Administered 2014-07-07: 30 mg via INTRAVENOUS
  Filled 2014-07-07: qty 1

## 2014-07-07 MED ORDER — DIPHENHYDRAMINE HCL 50 MG/ML IJ SOLN
25.0000 mg | Freq: Once | INTRAMUSCULAR | Status: AC
  Administered 2014-07-07: 25 mg via INTRAVENOUS
  Filled 2014-07-07: qty 1

## 2014-07-07 NOTE — Discharge Instructions (Signed)
Follow-up with Dr. Anne HahnWillis as needed. Return here for any new or worsening symptoms.
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
## 2014-07-07 NOTE — ED Notes (Signed)
Pt has had migraine x 3 days.  Using imitrex with no relief.  Nausea with light sensitivity

## 2014-07-08 ENCOUNTER — Telehealth: Payer: Self-pay

## 2014-07-08 NOTE — Telephone Encounter (Signed)
Left voicemail asking patient to call back to set up an appointment for a revisit.

## 2014-07-12 NOTE — Telephone Encounter (Signed)
Left voicemail asking patient to call back to set up an appointment for a revisit. 

## 2014-07-15 NOTE — Telephone Encounter (Signed)
I have mailed a letter to the patient requesting she contact us to schedule an appointment.

## 2014-07-15 NOTE — Telephone Encounter (Signed)
Left voicemail asking patient to call back to set up an appointment for a revisit. 

## 2014-07-21 ENCOUNTER — Inpatient Hospital Stay (HOSPITAL_COMMUNITY)
Admission: EM | Admit: 2014-07-21 | Discharge: 2014-07-21 | Disposition: A | Discharge status: Home / Self Care | Payer: Medicaid Other | Source: Ambulatory Visit | Attending: Obstetrics & Gynecology | Admitting: Obstetrics & Gynecology

## 2014-07-21 ENCOUNTER — Encounter (HOSPITAL_COMMUNITY): Payer: Self-pay | Admitting: *Deleted

## 2014-07-21 DIAGNOSIS — N390 Urinary tract infection, site not specified: Secondary | ICD-10-CM | POA: Insufficient documentation

## 2014-07-21 DIAGNOSIS — Z8249 Family history of ischemic heart disease and other diseases of the circulatory system: Secondary | ICD-10-CM | POA: Insufficient documentation

## 2014-07-21 DIAGNOSIS — N898 Other specified noninflammatory disorders of vagina: Secondary | ICD-10-CM | POA: Diagnosis present

## 2014-07-21 DIAGNOSIS — L409 Psoriasis, unspecified: Secondary | ICD-10-CM | POA: Diagnosis not present

## 2014-07-21 DIAGNOSIS — Z87891 Personal history of nicotine dependence: Secondary | ICD-10-CM | POA: Diagnosis not present

## 2014-07-21 DIAGNOSIS — Z79899 Other long term (current) drug therapy: Secondary | ICD-10-CM | POA: Diagnosis not present

## 2014-07-21 LAB — URINALYSIS, ROUTINE W REFLEX MICROSCOPIC
BILIRUBIN URINE: NEGATIVE
GLUCOSE, UA: NEGATIVE mg/dL
Ketones, ur: NEGATIVE mg/dL
NITRITE: NEGATIVE
Protein, ur: NEGATIVE mg/dL
Specific Gravity, Urine: 1.025 (ref 1.005–1.030)
Urobilinogen, UA: 0.2 mg/dL (ref 0.0–1.0)
pH: 5.5 (ref 5.0–8.0)

## 2014-07-21 LAB — URINE MICROSCOPIC-ADD ON

## 2014-07-21 LAB — WET PREP, GENITAL
Clue Cells Wet Prep HPF POC: NONE SEEN
TRICH WET PREP: NONE SEEN
YEAST WET PREP: NONE SEEN

## 2014-07-21 LAB — POCT PREGNANCY, URINE: Preg Test, Ur: NEGATIVE

## 2014-07-21 MED ORDER — NITROFURANTOIN MONOHYD MACRO 100 MG PO CAPS: 100.0000 mg | ORAL_CAPSULE | Freq: Two times a day (BID) | ORAL | Status: DC

## 2014-07-21 NOTE — MAU Note (Signed)
Pt was having vaginal irritation & itching, thought she had a yeast infection, took one time application of monistat 2 days ago.  Now has some burning & irritation with urination.
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
## 2014-07-21 NOTE — Discharge Instructions (Signed)

## 2014-07-22 LAB — GC/CHLAMYDIA PROBE AMP (~~LOC~~) NOT AT ARMC
CHLAMYDIA, DNA PROBE: NEGATIVE
NEISSERIA GONORRHEA: NEGATIVE

## 2014-08-08 ENCOUNTER — Encounter: Payer: Self-pay | Admitting: Neurology

## 2014-08-08 ENCOUNTER — Ambulatory Visit (INDEPENDENT_AMBULATORY_CARE_PROVIDER_SITE_OTHER): Payer: 59 | Admitting: Neurology

## 2014-08-08 VITALS — BP 112/72 | HR 68 | Resp 14 | Ht 63.0 in | Wt 136.0 lb

## 2014-08-08 DIAGNOSIS — G43009 Migraine without aura, not intractable, without status migrainosus: Secondary | ICD-10-CM | POA: Diagnosis not present

## 2014-08-08 MED ORDER — TOPIRAMATE 50 MG PO TABS: 100.0000 mg | ORAL_TABLET | Freq: Every day | ORAL | Status: DC

## 2014-08-08 MED ORDER — SUMATRIPTAN SUCCINATE 100 MG PO TABS: ORAL_TABLET | ORAL | Status: DC
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
## 2014-08-28 ENCOUNTER — Encounter: Payer: Self-pay | Admitting: Neurology

## 2014-10-03 ENCOUNTER — Ambulatory Visit: Payer: 59

## 2014-10-17 ENCOUNTER — Encounter: Payer: Self-pay | Admitting: Neurology

## 2014-10-19 ENCOUNTER — Encounter: Payer: Self-pay | Admitting: Neurology

## 2014-10-24 ENCOUNTER — Encounter: Payer: Self-pay | Admitting: Neurology

## 2014-11-10 ENCOUNTER — Encounter: Payer: Self-pay | Admitting: Neurology

## 2014-11-10 ENCOUNTER — Ambulatory Visit: Payer: Self-pay | Admitting: Neurology

## 2014-11-10 ENCOUNTER — Ambulatory Visit (INDEPENDENT_AMBULATORY_CARE_PROVIDER_SITE_OTHER): Payer: 59 | Admitting: Neurology

## 2014-11-10 DIAGNOSIS — G43009 Migraine without aura, not intractable, without status migrainosus: Secondary | ICD-10-CM | POA: Diagnosis not present

## 2014-11-10 MED ORDER — RIZATRIPTAN BENZOATE 10 MG PO TBDP: 10.0000 mg | ORAL_TABLET | ORAL | Status: DC | PRN

## 2014-11-10 MED ORDER — INDOMETHACIN 25 MG PO CAPS: ORAL_CAPSULE | ORAL | Status: DC
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
## 2014-11-27 ENCOUNTER — Encounter: Payer: Self-pay | Admitting: Neurology

## 2014-11-29 ENCOUNTER — Encounter: Payer: Self-pay | Admitting: Neurology

## 2014-11-30 ENCOUNTER — Telehealth: Payer: Self-pay | Admitting: Neurology

## 2014-11-30 ENCOUNTER — Encounter: Payer: Self-pay | Admitting: Neurology

## 2014-11-30 NOTE — Telephone Encounter (Signed)
Pt called and wanted to know if Dr. Epimenio Foot was receiving the emails she was sending via MyChart. The emails are attached to her chart. Just wanted it to be brought to someone's attention she has been emailing since Sunday . Thank you

## 2014-11-30 NOTE — Telephone Encounter (Signed)
Dr. Epimenio Foot has addressed her emails.  It appears he intended to send it back to [place], but it came back to me.  He had a # of questions, so when she calls back, I will address them with her, to be sure she has received his message./fim

## 2014-11-30 NOTE — Telephone Encounter (Signed)
I have spoken with Manasvi this afternoon.  She sts. she is having 2-5 migraines per month.  Sts. she has tried taking Maxalt and Indomethacin at the same time and it does not help her.  Sts. she is willing to try a new daily preventative (could not tolerate Topamax), and would also like to try a new rescue med.  Sts. Indomethacin and Maxalt do not help.  Sts. po Imitrex used to help, but had decreased efficacy over time.  She has not tried Cambia, injections, or nasal sprays.  Will speak with RAS when he returns to the office tomorrow and call pt. back/fim

## 2014-11-30 NOTE — Telephone Encounter (Signed)
LMTC.  We have received emails, however RAS is out of the office today, was out for several hrs. yesterday, so probably has not had time to address all of his messages.Chucky May

## 2014-12-01 ENCOUNTER — Encounter: Payer: Self-pay | Admitting: Neurology

## 2014-12-01 ENCOUNTER — Other Ambulatory Visit: Payer: Self-pay | Admitting: *Deleted

## 2014-12-01 MED ORDER — SUMATRIPTAN SUCCINATE 6 MG/0.5ML ~~LOC~~ SOCT: SUBCUTANEOUS | Status: DC

## 2014-12-01 NOTE — Telephone Encounter (Signed)
Lets try nortriptyline 25 mg one or two at night    #60   #3

## 2014-12-01 NOTE — Telephone Encounter (Signed)
See pt. email.  per RAS, ok for Imitrex inj./fim

## 2014-12-02 ENCOUNTER — Encounter: Payer: Self-pay | Admitting: Neurology

## 2014-12-02 MED ORDER — NORTRIPTYLINE HCL 25 MG PO CAPS: ORAL_CAPSULE | ORAL | Status: DC

## 2014-12-02 NOTE — Telephone Encounter (Signed)
I have emailed Victorino Dike and per RAS, offered Nortrip.  She sts. she is leary of potential se but will try.  Rx. escribed to [facility] in [place]./fim

## 2014-12-08 ENCOUNTER — Telehealth: Payer: Self-pay | Admitting: *Deleted

## 2014-12-08 NOTE — Telephone Encounter (Signed)
Patient form on Faith desk. 

## 2014-12-14 ENCOUNTER — Encounter: Payer: Self-pay | Admitting: Neurology

## 2014-12-14 ENCOUNTER — Ambulatory Visit (INDEPENDENT_AMBULATORY_CARE_PROVIDER_SITE_OTHER): Payer: 59 | Admitting: Neurology

## 2014-12-14 DIAGNOSIS — G43009 Migraine without aura, not intractable, without status migrainosus: Secondary | ICD-10-CM | POA: Diagnosis not present

## 2014-12-14 MED ORDER — PROMETHAZINE HCL 25 MG PO TABS: 25.0000 mg | ORAL_TABLET | Freq: Four times a day (QID) | ORAL | Status: DC | PRN
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
## 2014-12-29 ENCOUNTER — Encounter: Payer: Self-pay | Admitting: Neurology

## 2014-12-30 ENCOUNTER — Other Ambulatory Visit: Payer: Self-pay | Admitting: Surgery

## 2014-12-30 NOTE — H&P (Signed)
Curt BearsJennifer J. Kubly 12/30/2014 8:41 AM Location: [facility] Patient #: [id number] DOB: 1975-05-06 Single / Language: [place] / Race: White Female  History of Present Illness Ardeth Sportsman(Rebbie Lauricella C. Aikam Vinje MD; 12/30/2014 9:33 AM) Patient words: hems.  The patient is a 39 year old female who presents with hemorrhoids. Patient sent by her gynecologist, Dr. Marlow Baarsyanna Clark, for concern of symptomatic external hemorrhoids  Pleasant healthy active woman. Has struggled with hemorrhoids for decades. Think they were mild in the teenage years. Urgently worsened with her first pregnancy in her early twenties. She struggles with intermittent flares of swelling pain and bleeding. Worsening over the past year or so. She feels external hemorrhoids all the time. They calm down but still itching and burning and occasionally bleed. Using wet wipes. Topical medications no longer work. Blood when she wipes most of the time. Feels hemorrhoid popping out often. Sounds like sometimes thrombosed. Had worsening flare. Mentioned at her gynecologist. Surgical consultation offered.  Patient denies any history of Crohn's, ulcerative colitis, inflammatory bowel disease. No family history of bowel problems. No colon polyps or cancer. Has a bowel movement every day. Has intermittently used MiraLAX but confesses she's not been can to compliant on it. She's never had any injections banding or hemorrhoid surgery. She did have an episiotomy from vaginal delivery in the past but no issues of fecal incontinence to or incontinence to flatus. No urinary incontinence or other issues. No urinary tract infections. She is otherwise rather active.   Other Problems Fay Records(Ashley Beck, CMA; 12/30/2014 8:41 AM) Anxiety Disorder Depression Hemorrhoids Migraine Headache  Past Surgical History Fay Records(Ashley Beck, CMA; 12/30/2014 8:41 AM) Foot Surgery Bilateral. Oral Surgery  Diagnostic Studies History Fay Records(Ashley Beck, CMA; 12/30/2014  8:41 AM) Colonoscopy never Mammogram never Pap Smear 1-5 years ago  Allergies Fay Records(Ashley Beck, CMA; 12/30/2014 8:42 AM) Levaquin *Fluoroquinolones**  Medication History Fay Records(Ashley Beck, CMA; 12/30/2014 8:43 AM) Sprintec 28 (0.25-35MG -MCG Tablet, Oral) Active. Promethazine HCl (25MG  Tablet, Oral) Active. Lexapro (5MG  Tablet, Oral) Active. Multiple Vitamin (Oral) Active. Medications Reconciled  Social History Fay Records(Ashley Beck, [place]; 12/30/2014 8:41 AM) Alcohol use Occasional alcohol use. Caffeine use Carbonated beverages, Coffee, Tea. No drug use Tobacco use Former smoker.  Family History Fay Records(Ashley Beck, [place]; 12/30/2014 8:41 AM) Depression Sister. Hypertension Mother. Migraine Headache Father.  Pregnancy / Birth History Fay Records(Ashley Beck, CMA; 12/30/2014 8:41 AM) Age at menarche 15 years. Contraceptive History Oral contraceptives. Gravida 4 Maternal age 37-20 Para 3 Regular periods     Review of Systems Fay Records(Ashley Beck CMA; 12/30/2014 8:41 AM) General Not Present- Appetite Loss, Chills, Fatigue, Fever, Night Sweats, Weight Gain and Weight Loss. Skin Not Present- Change in Wart/Mole, Dryness, Hives, Jaundice, New Lesions, Non-Healing Wounds, Rash and Ulcer. HEENT Not Present- Earache, Hearing Loss, Hoarseness, Nose Bleed, Oral Ulcers, Ringing in the Ears, Seasonal Allergies, Sinus Pain, Sore Throat, Visual Disturbances, Wears glasses/contact lenses and Yellow Eyes. Respiratory Not Present- Bloody sputum, Chronic Cough, Difficulty Breathing, Snoring and Wheezing. Breast Not Present- Breast Mass, Breast Pain, Nipple Discharge and Skin Changes. Cardiovascular Not Present- Chest Pain, Difficulty Breathing Lying Down, Leg Cramps, Palpitations, Rapid Heart Rate, Shortness of Breath and Swelling of Extremities. Gastrointestinal Present- Bloating, Excessive gas, Hemorrhoids and Rectal Pain. Not Present- Abdominal Pain, Bloody Stool, Change in Bowel Habits, Chronic diarrhea, Constipation,  Difficulty Swallowing, Gets full quickly at meals, Indigestion, Nausea and Vomiting. Female Genitourinary Present- Nocturia. Not Present- Frequency, Painful Urination, Pelvic Pain and Urgency. Musculoskeletal Not Present- Back Pain, Joint Pain, Joint Stiffness, Muscle Pain, Muscle Weakness and Swelling of  Extremities. Neurological Present- Headaches, Numbness and Tremor. Not Present- Decreased Memory, Fainting, Seizures, Tingling, Trouble walking and Weakness. Psychiatric Present- Anxiety and Depression. Not Present- Bipolar, Change in Sleep Pattern, Fearful and Frequent crying. Endocrine Present- Hair Changes. Not Present- Cold Intolerance, Excessive Hunger, Heat Intolerance, Hot flashes and New Diabetes. Hematology Present- Easy Bruising. Not Present- Excessive bleeding, Gland problems, HIV and Persistent Infections.  Vitals Fay Records CMA; 12/30/2014 8:44 AM) 12/30/2014 8:43 AM Weight: 141 lb Height: 63in Body Surface Area: 1.67 m Body Mass Index: 24.98 kg/m  Temp.: 97.52F(Temporal)  Pulse: 80 (Regular)  BP: 126/76 (Sitting, Left Arm, Standard)      Physical Exam Ardeth Sportsman MD; 12/30/2014 9:11 AM)  General Mental Status-Alert. General Appearance-Not in acute distress, Not Sickly. Orientation-Oriented X3. Hydration-Well hydrated. Voice-Normal.  Integumentary Global Assessment Upon inspection and palpation of skin surfaces of the - Axillae: non-tender, no inflammation or ulceration, no drainage. and Distribution of scalp and body hair is normal. General Characteristics Temperature - normal warmth is noted.  Head and Neck Head-normocephalic, atraumatic with no lesions or palpable masses. Face Global Assessment - atraumatic, no absence of expression. Neck Global Assessment - no abnormal movements, no bruit auscultated on the right, no bruit auscultated on the left, no decreased range of motion, non-tender. Trachea-midline. Thyroid Gland  Characteristics - non-tender.  Eye Eyeball - Left-Extraocular movements intact, No Nystagmus. Eyeball - Right-Extraocular movements intact, No Nystagmus. Cornea - Left-No Hazy. Cornea - Right-No Hazy. Sclera/Conjunctiva - Left-No scleral icterus, No Discharge. Sclera/Conjunctiva - Right-No scleral icterus, No Discharge. Pupil - Left-Direct reaction to light normal. Pupil - Right-Direct reaction to light normal.  ENMT Ears Pinna - Left - no drainage observed, no generalized tenderness observed. Right - no drainage observed, no generalized tenderness observed. Nose and Sinuses External Inspection of the Nose - no destructive lesion observed. Inspection of the nares - Left - quiet respiration. Right - quiet respiration. Mouth and Throat Lips - Upper Lip - no fissures observed, no pallor noted. Lower Lip - no fissures observed, no pallor noted. Nasopharynx - no discharge present. Oral Cavity/Oropharynx - Tongue - no dryness observed. Oral Mucosa - no cyanosis observed. Hypopharynx - no evidence of airway distress observed.  Chest and Lung Exam Inspection Movements - Normal and Symmetrical. Accessory muscles - No use of accessory muscles in breathing. Palpation Palpation of the chest reveals - Non-tender. Auscultation Breath sounds - Normal and Clear.  Cardiovascular Auscultation Rhythm - Regular. Murmurs & Other Heart Sounds - Auscultation of the heart reveals - No Murmurs and No Systolic Clicks.  Abdomen Inspection Inspection of the abdomen reveals - No Visible peristalsis and No Abnormal pulsations. Umbilicus - No Bleeding, No Urine drainage. Palpation/Percussion Palpation and Percussion of the abdomen reveal - Soft, Non Tender, No Rebound tenderness, No Rigidity (guarding) and No Cutaneous hyperesthesia. Note: Soft and flat. No diastases. Supraumbilical piercing. No umbilical hernia. No pain. No tenderness.  Female Genitourinary Sexual Maturity Tanner 5 -  Adult hair pattern. Note: No inguinal hernias. Normal external genitalia. No vaginal bleeding nor discharge  Rectal Note: Perianal skin clean. RIGHT posterior smaller than RIGHT anterior external hemorrhoid. Normal sphincter tone. No fissure. No fistula. No pruritus. No pilonidal disease.  Anoscopy reveals large RIGHT anterior greater than RIGHT posterior internal hemorrhoids. Grade 2- 3. LEFT lateral grade 2.  Peripheral Vascular Upper Extremity Inspection - Left - No Cyanotic nailbeds, Not Ischemic. Right - No Cyanotic nailbeds, Not Ischemic.  Neurologic Neurologic evaluation reveals -normal attention span and ability to concentrate,  able to name objects and repeat phrases. Appropriate fund of knowledge , normal sensation and normal coordination. Mental Status Affect - not angry, not paranoid. Cranial Nerves-Normal Bilaterally. Gait-Normal.  Neuropsychiatric Mental status exam performed with findings of-able to articulate well with normal speech/language, rate, volume and coherence, thought content normal with ability to perform basic computations and apply abstract reasoning and no evidence of hallucinations, delusions, obsessions or homicidal/suicidal ideation.  Musculoskeletal Global Assessment Spine, Ribs and Pelvis - no instability, subluxation or laxity. Right Upper Extremity - no instability, subluxation or laxity.  Lymphatic Head & Neck  General Head & Neck Lymphatics: Bilateral - Description - No Localized lymphadenopathy. Axillary  General Axillary Region: Bilateral - Description - No Localized lymphadenopathy. Femoral & Inguinal  Generalized Femoral & Inguinal Lymphatics: Left - Description - No Localized lymphadenopathy. Right - Description - No Localized lymphadenopathy.   Results Ardeth Sportsman MD; 12/30/2014 9:18 AM) Procedures  Name Value Date ANOSCOPY, DIAGNOSTIC [phone]) [ Hemorrhoids ] Procedure Anal exam: External Hemorrhoid Internal  exam: Internal Hemorrhoids (bleeding) Internal Hemorroids ( non-bleeding) prolapse Other: Please see section of rectal physical exam. Grade 2 and 3 internal hemorrhoids with external hemorrhoids.  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 Also with external hemorrhoids causing itching and discomfort.  I offerred the patient several options. At the very least, she needs to stay compliant on a fiber supplement every day the rest of her life.  Because her defecation is in the normal range, reasonable to be more aggressive in treating the hemorrhoids. I think the internal hemorrhoids could benefit from a few banding's although the RIGHT anterior intermittently prolapses at least 8 and serial bandings may not work.  Her biggest concern is the intermittently prolapsing hemorrhoid as well as external hemorrhoids. She would rather have everything dealt with at once. I would involve surgery. Reasonable to do hemorrhoidal internal ligation with pexy and excision of remaining external disease. Did caution her that she'll have more of recovery time but hopefully this gives the best way to correct both the internal & external anatomy. She's been trying to deal with this for 20 years and wishes to be more aggressive.  Current Plans You are being scheduled for surgery - Our schedulers will call you.  You should hear from our office's scheduling department within 5 working days about the location, date, and time of surgery. We try to make accommodations for patient's preferences in scheduling surgery, but sometimes the OR schedule or the surgeon's schedule prevents [place] from making those accommodations.  If you have not heard from our office [phone]) in 5 working days, call the office and  ask for your surgeon's nurse.  If you have other questions about your diagnosis, plan, or surgery, call the office and ask for your surgeon's nurse.  The anatomy & physiology of the anorectal region was discussed. The pathophysiology of hemorrhoids and differential diagnosis was discussed. Natural history risks without surgery was discussed. I stressed the importance of a bowel regimen to have daily soft bowel movements to minimize progression of disease. Interventions such as sclerotherapy & banding were discussed.  The patient's symptoms are not adequately controlled by medicines and other non-operative treatments. I feel the risks & problems of no surgery outweigh the operative risks; therefore, I recommended surgery to treat the hemorrhoids by ligation, pexy, and  possible resection.  Risks such as bleeding, infection, urinary difficulties, need for further treatment, heart attack, death, and other risks were discussed. I noted a good likelihood this will help address the problem. Goals of post-operative recovery were discussed as well. Possibility that this will not correct all symptoms was explained. Post-operative pain, bleeding, constipation, and other problems after surgery were discussed. We will work to minimize complications. Educational handouts further explaining the pathology, treatment options, and bowel regimen were given as well. Questions were answered. The patient expresses understanding & wishes to proceed with surgery.  ANOSCOPY, DIAGNOSTIC (40981) Pt Education - [facility] Hemorrhoids (Sharae Zappulla): discussed with patient and provided information. Pt Education - Pamphlet Given - The Hemorrhoid Book: discussed with patient and provided information. Pt Education - [facility] Rectal Prep for Anorectal outpatient/office surgery: discussed with patient and provided information. PROLAPSED INTERNAL HEMORRHOIDS, GRADE 2 (K64.1)  EXTERNAL HEMORRHOIDS WITH COMPLICATION (K64.4)  Ardeth Sportsman, M.D.,  F.A.C.S. Gastrointestinal and Minimally Invasive Surgery [facility] [address], [address] [place], [place] [address] [phone] Main / Paging

## 2015-01-13 NOTE — Patient Instructions (Addendum)
Mary FusJennifer J [name]  01/13/2015   Your procedure is scheduled on: TO BE CONTACTED IN REGARDS TO SURGICAL DATE AND TIME   [facility]  [facility] to 3rd floor to  Short Stay Center at AM.  Call this number if you have problems the morning of surgery [phone]   Remember: ONLY 1 PERSON MAY GO WITH YOU TO SHORT STAY TO GET  READY MORNING OF YOUR SURGERY.  Do not eat food or drink liquids :After Midnight.     Take these medicines the morning of surgery with A SIP OF WATER: LORAZEPAM (ATIVAN) IF NEEDED              You may not have any metal on your body including hair pins and              piercings  Do not wear jewelry, make-up, lotions, powders or perfumes, deodorant             Do not wear nail polish.  Do not shave  48 hours prior to surgery.               Do not bring valuables to the hospital. [facility] IS NOT             RESPONSIBLE   FOR VALUABLES.  Contacts, dentures or bridgework may not be worn into surgery.      Patients discharged the day of surgery will not be allowed to drive home.  Name and phone number of your driver:[name] (boyfriend)  _____________________________________________________________________             [facility] Pre[facility] Before surgery, you can play an important role.  Because skin is not sterile, your skin needs to be as free of germs as possible.  You can reduce the number of germs on your skin by washing with CHG (chlorahexidine gluconate) soap before surgery.  CHG is an antiseptic cleaner which kills germs and bonds with the skin to continue killing germs even after washing. Please DO NOT use if you have an allergy to CHG or antibacterial soaps.  If your skin becomes reddened/irritated stop using the CHG and inform your nurse when you arrive at Short Stay. Do not shave (including legs and underarms) for at least 48 hours prior to the first CHG shower.  You may shave  your face/neck. Please follow these instructions carefully:  1.  Shower with CHG Soap the night before surgery and the  morning of Surgery.  2.  If you choose to wash your hair, wash your hair first as usual with your  normal  shampoo.  3.  After you shampoo, rinse your hair and body thoroughly to remove the  shampoo.                           4.  Use CHG as you would any other liquid soap.  You can apply chg directly  to the skin and wash                       Gently with a scrungie or clean washcloth.  5.  Apply the CHG Soap to your body ONLY FROM THE NECK DOWN.   Do not use on face/ open  Wound or open sores. Avoid contact with eyes, ears mouth and genitals (private parts).                       Wash face,  Genitals (private parts) with your normal soap.             6.  Wash thoroughly, paying special attention to the area where your surgery  will be performed.  7.  Thoroughly rinse your body with warm water from the neck down.  8.  DO NOT shower/wash with your normal soap after using and rinsing off  the CHG Soap.                9.  Pat yourself dry with a clean towel.            10.  Wear clean pajamas.            11.  Place clean sheets on your bed the night of your first shower and do not  sleep with pets. Day of Surgery : Do not apply any lotions/deodorants the morning of surgery.  Please wear clean clothes to the hospital/surgery center.  FAILURE TO FOLLOW THESE INSTRUCTIONS MAY RESULT IN THE CANCELLATION OF YOUR SURGERY PATIENT SIGNATURE_________________________________  NURSE SIGNATURE__________________________________  ________________________________________________________________________

## 2015-01-16 ENCOUNTER — Encounter (HOSPITAL_COMMUNITY)
Admission: RE | Admit: 2015-01-16 | Discharge: 2015-01-16 | Disposition: A | Discharge status: Home / Self Care | Payer: 59 | Source: Ambulatory Visit | Attending: Surgery | Admitting: Surgery

## 2015-01-16 ENCOUNTER — Encounter (HOSPITAL_COMMUNITY): Payer: Self-pay

## 2015-01-16 DIAGNOSIS — Z01818 Encounter for other preprocedural examination: Secondary | ICD-10-CM | POA: Diagnosis not present

## 2015-01-16 LAB — BASIC METABOLIC PANEL
Anion gap: 8 (ref 5–15)
BUN: 16 mg/dL (ref 6–20)
CHLORIDE: 101 mmol/L (ref 101–111)
CO2: 30 mmol/L (ref 22–32)
CREATININE: 0.88 mg/dL (ref 0.44–1.00)
Calcium: 9.5 mg/dL (ref 8.9–10.3)
GFR calc Af Amer: >60 mL/min (ref >60)
GFR calc non Af Amer: >60 mL/min (ref >60)
GLUCOSE: 102 mg/dL — AB (ref 65–99)
Potassium: 3.7 mmol/L (ref 3.5–5.1)
SODIUM: 139 mmol/L (ref 135–145)

## 2015-01-16 LAB — CBC
HCT: 39.9 % (ref 36.0–46.0)
HEMOGLOBIN: 13.2 g/dL (ref 12.0–15.0)
MCH: 30.4 pg (ref 26.0–34.0)
MCHC: 33.1 g/dL (ref 30.0–36.0)
MCV: 91.9 fL (ref 78.0–100.0)
PLATELETS: 287 10*3/uL (ref 150–400)
RBC: 4.34 MIL/uL (ref 3.87–5.11)
RDW: 12.4 % (ref 11.5–15.5)
WBC: 9.2 10*3/uL (ref 4.0–10.5)

## 2015-01-16 LAB — HCG, SERUM, QUALITATIVE: Preg, Serum: NEGATIVE

## 2015-01-25 DIAGNOSIS — F411 Generalized anxiety disorder: Secondary | ICD-10-CM | POA: Insufficient documentation

## 2015-01-26 ENCOUNTER — Ambulatory Visit: Payer: Medicaid Other | Attending: Neurological Surgery | Admitting: Physical Therapy

## 2015-01-26 DIAGNOSIS — M542 Cervicalgia: Secondary | ICD-10-CM | POA: Diagnosis not present

## 2015-01-26 DIAGNOSIS — R293 Abnormal posture: Secondary | ICD-10-CM | POA: Diagnosis present

## 2015-01-26 DIAGNOSIS — M5382 Other specified dorsopathies, cervical region: Secondary | ICD-10-CM | POA: Diagnosis present

## 2015-01-26 DIAGNOSIS — M436 Torticollis: Secondary | ICD-10-CM | POA: Diagnosis present

## 2015-01-26 NOTE — Patient Instructions (Signed)
Extensors, Supine   Lie supine, head on small, rolled towel. Gently tuck chin and bring toward chest. Hold __2_ seconds. Repeat __8_ times per session. Do __5_ sessions per day.  Copyright  VHI. All rights reserved.  Flexibility: Neck Retraction   Pull head straight back, keeping eyes and jaw level. Repeat __8__ times per set. Do __1__ sets per session. Do __5__ sessions per day.  http://orth.exer.us/344   Copyright  VHI. All rights reserved.
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
## 2015-01-30 ENCOUNTER — Ambulatory Visit: Payer: Medicaid Other | Admitting: Physical Therapy

## 2015-01-30 DIAGNOSIS — M5382 Other specified dorsopathies, cervical region: Secondary | ICD-10-CM

## 2015-01-30 DIAGNOSIS — M436 Torticollis: Secondary | ICD-10-CM

## 2015-01-30 DIAGNOSIS — R293 Abnormal posture: Secondary | ICD-10-CM

## 2015-01-30 DIAGNOSIS — M542 Cervicalgia: Secondary | ICD-10-CM | POA: Diagnosis not present
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
## 2015-01-30 NOTE — Patient Instructions (Signed)

## 2015-02-01 ENCOUNTER — Ambulatory Visit: Payer: Medicaid Other | Admitting: Physical Therapy

## 2015-02-01 ENCOUNTER — Encounter: Payer: Self-pay | Admitting: Neurology

## 2015-02-01 DIAGNOSIS — R293 Abnormal posture: Secondary | ICD-10-CM

## 2015-02-01 DIAGNOSIS — M5382 Other specified dorsopathies, cervical region: Secondary | ICD-10-CM

## 2015-02-01 DIAGNOSIS — M542 Cervicalgia: Secondary | ICD-10-CM | POA: Diagnosis not present

## 2015-02-01 DIAGNOSIS — M436 Torticollis: Secondary | ICD-10-CM
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
## 2015-02-01 NOTE — Telephone Encounter (Signed)
I called [facility].  Spoke with Tobi BastosAnna.  Says the patient has several refills on file, but the ins was rejecting the claim.  They were faxing request to incorrect fax number.  I have verified current info, and they will update records.  I called Medicaid.  Spoke with Darleen.  She said this drug is covered under ins, however, when they were processing the claim, it was refill too soon.  Stated the Rx is now due, so if the pharmacy rerocesses the request, it should go through without any issues.  I called [facility] back again.  Spoke with OmnicomKayla.  She reprocessed Rx and verified it did go through without any rejections.  Responded to patient with this update via MyChart.

## 2015-02-06 ENCOUNTER — Ambulatory Visit: Payer: Medicaid Other | Admitting: Physical Therapy

## 2015-02-06 DIAGNOSIS — R293 Abnormal posture: Secondary | ICD-10-CM

## 2015-02-06 DIAGNOSIS — M5382 Other specified dorsopathies, cervical region: Secondary | ICD-10-CM

## 2015-02-06 DIAGNOSIS — M436 Torticollis: Secondary | ICD-10-CM

## 2015-02-06 DIAGNOSIS — M542 Cervicalgia: Secondary | ICD-10-CM | POA: Diagnosis not present

## 2015-02-06 NOTE — Patient Instructions (Signed)
Over Head Pull: Narrow Grip       On back, knees bent, feet flat, band across thighs, elbows straight but relaxed. Pull hands apart (start). Keeping elbows straight, bring arms up and over head, hands toward floor. Keep pull steady on band. Hold momentarily. Return slowly, keeping pull steady, back to start. Repeat 10 - 30___ times. Band color red______   Also wide grip issued 10 - 30 X   Side Pull: Double Arm   On back, knees bent, feet flat. Arms perpendicular to body, shoulder level, elbows straight but relaxed. Pull arms out to sides, elbows straight. Resistance band comes across collarbones, hands toward floor. Hold momentarily. Slowly return to starting position. Repeat _10 - 30__ times. Band color __red___   [name]   On back, knees bent, feet flat, left hand on left hip, right hand above left. Pull right arm DIAGONALLY (hip to shoulder) across chest. Bring right arm along head toward floor. Hold momentarily. Slowly return to starting position. Repeat _10 - 30__ times. Do with left arm. Band color __red____   Shoulder Rotation: Double Arm   On back, knees bent, feet flat, elbows tucked at sides, bent 90, hands palms up. Pull hands apart and down toward floor, keeping elbows near sides. Hold momentarily. Slowly return to starting position. Repea t10-30___ times. Band color red ______    4 days a week
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
## 2015-02-08 ENCOUNTER — Encounter: Payer: 59 | Admitting: Physical Therapy

## 2015-02-08 ENCOUNTER — Encounter: Payer: Self-pay | Admitting: Neurology

## 2015-02-08 NOTE — Progress Notes (Signed)
02-08-15 1225 Left message on voice mail. To arrive [facility] on 02-13-15 at 0700 AM, [facility] entrance, follow signs to 3 East elevators to 3rd Floor- [facility] to [facility]. Don't eat or drink past midnight -but take Ativan with sip water if need, Take Prednisone if remaining taking -usual AM dose with sip of water only. Use CHG soap cleanser as instructed. Please call [facility] Pre surgical testing  to confirm receipt of these instructions.

## 2015-02-09 ENCOUNTER — Ambulatory Visit: Payer: Medicaid Other | Admitting: Physical Therapy

## 2015-02-09 DIAGNOSIS — M542 Cervicalgia: Secondary | ICD-10-CM | POA: Diagnosis not present

## 2015-02-09 DIAGNOSIS — R293 Abnormal posture: Secondary | ICD-10-CM

## 2015-02-09 DIAGNOSIS — M436 Torticollis: Secondary | ICD-10-CM

## 2015-02-09 DIAGNOSIS — M5382 Other specified dorsopathies, cervical region: Secondary | ICD-10-CM
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
## 2015-02-09 NOTE — Patient Instructions (Signed)
Instruction in cervical towel SNAG 10x each direction;  Tennis ball suboccipital release for home 5 min

## 2015-02-10 ENCOUNTER — Other Ambulatory Visit: Payer: Self-pay | Admitting: Obstetrics and Gynecology

## 2015-02-13 ENCOUNTER — Ambulatory Visit (HOSPITAL_COMMUNITY): Admission: RE | Admit: 2015-02-13 | Payer: 59 | Source: Ambulatory Visit | Admitting: Surgery

## 2015-02-13 ENCOUNTER — Encounter: Payer: 59 | Admitting: Physical Therapy

## 2015-02-13 ENCOUNTER — Encounter (HOSPITAL_COMMUNITY): Admission: RE | Payer: Self-pay | Source: Ambulatory Visit

## 2015-02-13 LAB — CYTOLOGY - PAP

## 2015-02-13 SURGERY — TRANSANAL HEMORRHOIDAL DEARTERIALIZATION
Anesthesia: General

## 2015-02-15 ENCOUNTER — Encounter: Payer: 59 | Admitting: Physical Therapy

## 2015-02-26 DIAGNOSIS — J189 Pneumonia, unspecified organism: Secondary | ICD-10-CM

## 2015-02-26 HISTORY — DX: Pneumonia, unspecified organism: J18.9

## 2015-03-01 ENCOUNTER — Ambulatory Visit: Payer: Medicaid Other | Attending: Neurological Surgery | Admitting: Physical Therapy

## 2015-03-01 ENCOUNTER — Telehealth: Payer: Self-pay | Admitting: Physical Therapy

## 2015-03-01 NOTE — Telephone Encounter (Signed)
LVM that pt missed her last scheduled visit today and if she need to continue therapy to call back and reschedule, if not we will discharge from therapy.

## 2015-03-14 ENCOUNTER — Encounter (HOSPITAL_BASED_OUTPATIENT_CLINIC_OR_DEPARTMENT_OTHER): Payer: Self-pay | Admitting: *Deleted

## 2015-03-15 ENCOUNTER — Encounter (HOSPITAL_BASED_OUTPATIENT_CLINIC_OR_DEPARTMENT_OTHER): Payer: Self-pay | Admitting: *Deleted

## 2015-03-15 ENCOUNTER — Other Ambulatory Visit: Payer: Self-pay | Admitting: Surgery

## 2015-03-15 NOTE — H&P (Signed)
[name]. Woldt 12/30/2014 8:41 AM Location: [facility] Patient #: [id number] DOB: [birth year] Single / Language: [place] / Race: White Female   History of Present Illness Ardeth Sportsman MD; 12/30/2014 9:33 AM) Patient words: hems.  The patient is a 40 year old female who presents with hemorrhoids. Patient sent by her gynecologist, Dr. Marlow Baars, for concern of symptomatic external hemorrhoids  Pleasant healthy active woman. Has struggled with hemorrhoids for decades. Think they were mild in the teenage years. Urgently worsened with her first pregnancy in her early twenties. She struggles with intermittent flares of swelling pain and bleeding. Worsening over the past year or so. She feels external hemorrhoids all the time. They calm down but still itching and burning and occasionally bleed. Using wet wipes. Topical medications no longer work. Blood when she wipes most of the time. Feels hemorrhoid popping out often. Sounds like sometimes thrombosed. Had worsening flare. Mentioned at her gynecologist. Surgical consultation offered.  Patient denies any history of Crohn's, ulcerative colitis, inflammatory bowel disease. No family history of bowel problems. No colon polyps or cancer. Has a bowel movement every day. Has intermittently used MiraLAX but confesses she's not been can to compliant on it. She's never had any injections banding or hemorrhoid surgery. She did have an episiotomy from vaginal delivery in the past but no issues of fecal incontinence to or incontinence to flatus. No urinary incontinence or other issues. No urinary tract infections. She is otherwise rather active.   Other Problems Fay Records, CMA; 12/30/2014 8:41 AM) Anxiety Disorder Depression Hemorrhoids Migraine Headache  Past Surgical History Fay Records, CMA; 12/30/2014 8:41 AM) Foot Surgery Bilateral. Oral Surgery  Diagnostic Studies History Fay Records, CMA;  12/30/2014 8:41 AM) Colonoscopy never Mammogram never Pap Smear 1-5 years ago  Allergies Fay Records, CMA; 12/30/2014 8:42 AM) Levaquin *Fluoroquinolones**  Medication History Fay Records, CMA; 12/30/2014 8:43 AM) Sprintec 28 (0.25-35MG -MCG Tablet, Oral) Active. Promethazine HCl (  Tablet, Oral) Active. Lexapro (  Tablet, Oral) Active. Multiple Vitamin (Oral) Active. Medications Reconciled  Social History Fay Records, [place]; 12/30/2014 8:41 AM) Alcohol use Occasional alcohol use. Caffeine use Carbonated beverages, Coffee, Tea. No drug use Tobacco use Former smoker.  Family History Fay Records, [place]; 12/30/2014 8:41 AM) Depression Sister. Hypertension Mother. Migraine Headache Father.  Pregnancy / Birth History Fay Records, CMA; 12/30/2014 8:41 AM) Age at menarche 15 years. Contraceptive History Oral contraceptives. Gravida 4 Maternal age 37-20 Para 3 Regular periods    Review of Systems Fay Records CMA; 12/30/2014 8:41 AM) General Not Present- Appetite Loss, Chills, Fatigue, Fever, Night Sweats, Weight Gain and Weight Loss. Skin Not Present- Change in Wart/Mole, Dryness, Hives, Jaundice, New Lesions, Non-Healing Wounds, Rash and Ulcer. HEENT Not Present- Earache, Hearing Loss, Hoarseness, Nose Bleed, Oral Ulcers, Ringing in the Ears, Seasonal Allergies, Sinus Pain, Sore Throat, Visual Disturbances, Wears glasses/contact lenses and Yellow Eyes. Respiratory Not Present- Bloody sputum, Chronic Cough, Difficulty Breathing, Snoring and Wheezing. Breast Not Present- Breast Mass, Breast Pain, Nipple Discharge and Skin Changes. Cardiovascular Not Present- Chest Pain, Difficulty Breathing Lying Down, Leg Cramps, Palpitations, Rapid Heart Rate, Shortness of Breath and Swelling of Extremities. Gastrointestinal Present- Bloating, Excessive gas, Hemorrhoids and Rectal Pain. Not Present- Abdominal Pain, Bloody Stool, Change in Bowel Habits, Chronic diarrhea,  Constipation, Difficulty Swallowing, Gets full quickly at meals, Indigestion, Nausea and Vomiting. Female Genitourinary Present- Nocturia. Not Present- Frequency, Painful Urination, Pelvic Pain and Urgency. Musculoskeletal Not Present- Back Pain, Joint Pain, Joint Stiffness, Muscle Pain, Muscle Weakness and Swelling of  Extremities. Neurological Present- Headaches, Numbness and Tremor. Not Present- Decreased Memory, Fainting, Seizures, Tingling, Trouble walking and Weakness. Psychiatric Present- Anxiety and Depression. Not Present- Bipolar, Change in Sleep Pattern, Fearful and Frequent crying. Endocrine Present- Hair Changes. Not Present- Cold Intolerance, Excessive Hunger, Heat Intolerance, Hot flashes and New Diabetes. Hematology Present- Easy Bruising. Not Present- Excessive bleeding, Gland problems, HIV and Persistent Infections.  Vitals Fay Records CMA; 12/30/2014 8:44 AM) 12/30/2014 8:43 AM Weight: 141 lb Height: 63in Body Surface Area: 1.67 m Body Mass Index: 24.98 kg/m  Temp.: 97.89F(Temporal)  Pulse: 80 (Regular)  BP: 126/76 (Sitting, Left Arm, Standard)       Physical Exam Ardeth Sportsman MD; 12/30/2014 9:11 AM) General Mental Status-Alert. General Appearance-Not in acute distress, Not Sickly. Orientation-Oriented X3. Hydration-Well hydrated. Voice-Normal.  Integumentary Global Assessment Upon inspection and palpation of skin surfaces of the - Axillae: non-tender, no inflammation or ulceration, no drainage. and Distribution of scalp and body hair is normal. General Characteristics Temperature - normal warmth is noted.  Head and Neck Head-normocephalic, atraumatic with no lesions or palpable masses. Face Global Assessment - atraumatic, no absence of expression. Neck Global Assessment - no abnormal movements, no bruit auscultated on the right, no bruit auscultated on the left, no decreased range of motion,  non-tender. Trachea-midline. Thyroid Gland Characteristics - non-tender.  Eye Eyeball - Left-Extraocular movements intact, No Nystagmus. Eyeball - Right-Extraocular movements intact, No Nystagmus. Cornea - Left-No Hazy. Cornea - Right-No Hazy. Sclera/Conjunctiva - Left-No scleral icterus, No Discharge. Sclera/Conjunctiva - Right-No scleral icterus, No Discharge. Pupil - Left-Direct reaction to light normal. Pupil - Right-Direct reaction to light normal.  ENMT Ears Pinna - Left - no drainage observed, no generalized tenderness observed. Right - no drainage observed, no generalized tenderness observed. Nose and Sinuses External Inspection of the Nose - no destructive lesion observed. Inspection of the nares - Left - quiet respiration. Right - quiet respiration. Mouth and Throat Lips - Upper Lip - no fissures observed, no pallor noted. Lower Lip - no fissures observed, no pallor noted. Nasopharynx - no discharge present. Oral Cavity/Oropharynx - Tongue - no dryness observed. Oral Mucosa - no cyanosis observed. Hypopharynx - no evidence of airway distress observed.  Chest and Lung Exam Inspection Movements - Normal and Symmetrical. Accessory muscles - No use of accessory muscles in breathing. Palpation Palpation of the chest reveals - Non-tender. Auscultation Breath sounds - Normal and Clear.  Cardiovascular Auscultation Rhythm - Regular. Murmurs & Other Heart Sounds - Auscultation of the heart reveals - No Murmurs and No Systolic Clicks.  Abdomen Inspection Inspection of the abdomen reveals - No Visible peristalsis and No Abnormal pulsations. Umbilicus - No Bleeding, No Urine drainage. Palpation/Percussion Palpation and Percussion of the abdomen reveal - Soft, Non Tender, No Rebound tenderness, No Rigidity (guarding) and No Cutaneous hyperesthesia. Note: Soft and flat. No diastases. Supraumbilical piercing. No umbilical hernia. No pain. No  tenderness.   Female Genitourinary Sexual Maturity Tanner 5 - Adult hair pattern. Note: No inguinal hernias. Normal external genitalia. No vaginal bleeding nor discharge   Rectal Note: Perianal skin clean. RIGHT posterior smaller than RIGHT anterior external hemorrhoid. Normal sphincter tone. No fissure. No fistula. No pruritus. No pilonidal disease.  Anoscopy reveals large RIGHT anterior greater than RIGHT posterior internal hemorrhoids. Grade 2- 3. LEFT lateral grade 2.   Peripheral Vascular Upper Extremity Inspection - Left - No Cyanotic nailbeds, Not Ischemic. Right - No Cyanotic nailbeds, Not Ischemic.  Neurologic Neurologic evaluation reveals -normal attention span and  ability to concentrate, able to name objects and repeat phrases. Appropriate fund of knowledge , normal sensation and normal coordination. Mental Status Affect - not angry, not paranoid. Cranial Nerves-Normal Bilaterally. Gait-Normal.  Neuropsychiatric Mental status exam performed with findings of-able to articulate well with normal speech/language, rate, volume and coherence, thought content normal with ability to perform basic computations and apply abstract reasoning and no evidence of hallucinations, delusions, obsessions or homicidal/suicidal ideation.  Musculoskeletal Global Assessment Spine, Ribs and Pelvis - no instability, subluxation or laxity. Right Upper Extremity - no instability, subluxation or laxity.  Lymphatic Head & Neck  General Head & Neck Lymphatics: Bilateral - Description - No Localized lymphadenopathy. Axillary  General Axillary Region: Bilateral - Description - No Localized lymphadenopathy. Femoral & Inguinal  Generalized Femoral & Inguinal Lymphatics: Left - Description - No Localized lymphadenopathy. Right - Description - No Localized lymphadenopathy.   Results Ardeth Sportsman MD; 12/30/2014 9:18 AM) Procedures  Name Value Date ANOSCOPY, DIAGNOSTIC [phone]) [  Hemorrhoids ] Procedure Anal exam: External Hemorrhoid Internal exam: Internal Hemorrhoids (bleeding) Internal Hemorroids ( non-bleeding) prolapse Other: Please see section of rectal physical exam. Grade 2 and 3 internal hemorrhoids with external hemorrhoids.  Performed: 12/30/2014 9:17 AM    Assessment & Plan  PROLAPSED INTERNAL HEMORRHOIDS, GRADE 3 (K64.2) Impression: Moderately significant internal hemorrhoids with persistent issues of bleeding pain and discomfort. Also with external hemorrhoids causing itching and discomfort.  I offerred the patient several options. At the very least, she needs to stay compliant on a fiber supplement every day the rest of her life.  Because her defecation is in the normal range, reasonable to be more aggressive in treating the hemorrhoids. I think the internal hemorrhoids could benefit from a few banding's although the RIGHT anterior intermittently prolapses at least and serial bandings may not work.  Her biggest concern is the intermittently prolapsing hemorrhoid as well as external hemorrhoids. She would rather have everything dealt with at once. I would involve surgery. Reasonable to do hemorrhoidal internal ligation with pexy and excision of remaining external disease. Did caution her that she'll have more of recovery time but hopefully this gives the best way to correct both the internal & external anatomy. She's been trying to deal with this for 20 years and wishes to be more aggressive.   Current Plans You are being scheduled for surgery - Our schedulers will call you.  You should hear from our office's scheduling department within 5 working days about the location, date, and time of surgery. We try to make accommodations for patient's preferences in scheduling surgery, but sometimes the OR schedule or the surgeon's schedule prevents [place] from making those accommodations.  If you have not heard from our office [phone]) in 5  working days, call the office and ask for your surgeon's nurse.  If you have other questions about your diagnosis, plan, or surgery, call the office and ask for your surgeon's nurse.  The anatomy & physiology of the anorectal region was discussed. The pathophysiology of hemorrhoids and differential diagnosis was discussed. Natural history risks without surgery was discussed. I stressed the importance of a bowel regimen to have daily soft bowel movements to minimize progression of disease. Interventions such as sclerotherapy & banding were discussed.  The patient's symptoms are not adequately controlled by medicines and other non-operative treatments. I feel the risks & problems of no surgery outweigh the operative risks; therefore, I recommended surgery to treat the hemorrhoids by ligation, pexy, and possible resection.  Risks  such as bleeding, infection, urinary difficulties, need for further treatment, heart attack, death, and other risks were discussed. I noted a good likelihood this will help address the problem. Goals of post-operative recovery were discussed as well. Possibility that this will not correct all symptoms was explained. Post-operative pain, bleeding, constipation, and other problems after surgery were discussed. We will work to minimize complications. Educational handouts further explaining the pathology, treatment options, and bowel regimen were given as well. Questions were answered. The patient expresses understanding & wishes to proceed with surgery.  ANOSCOPY, DIAGNOSTIC (16109) Pt Education - [facility] Hemorrhoids (Medardo Hassing): discussed with patient and provided information. Pt Education - Pamphlet Given - The Hemorrhoid Book: discussed with patient and provided information. Pt Education - [facility] Rectal Prep for Anorectal outpatient/office surgery: discussed with patient and provided information. PROLAPSED INTERNAL HEMORRHOIDS, GRADE 2 (K64.1) EXTERNAL HEMORRHOIDS WITH COMPLICATION  (K64.4)   Ardeth Sportsman, M.D., F.A.C.S. Gastrointestinal and Minimally Invasive Surgery [facility] [address], [address] [place], [place] [address] [phone] Main / Paging

## 2015-03-16 ENCOUNTER — Encounter (HOSPITAL_BASED_OUTPATIENT_CLINIC_OR_DEPARTMENT_OTHER): Payer: Self-pay | Admitting: *Deleted

## 2015-03-16 NOTE — Progress Notes (Addendum)
NPO AFTER MN.  ARRIVE AT 0745.  NEEDS HG AND URINE PREG.  WILL TAKE ORTHO-CYCLEN AND PRESCRIPTION REFLUX MED. AM DOS W/ SIPS OF WATER. WILL DO HIBICLENS SHOWER HS BEFORE AND AM DOS.  PT VERBALIZED UNDERSTANDING OF RECTAL PREP, WAS INSTRUCTIONS FROM NURSE AT OFFICE.

## 2015-03-21 ENCOUNTER — Ambulatory Visit: Payer: 59 | Admitting: Neurology

## 2015-03-23 ENCOUNTER — Encounter (HOSPITAL_BASED_OUTPATIENT_CLINIC_OR_DEPARTMENT_OTHER): Payer: Self-pay | Admitting: *Deleted

## 2015-03-23 ENCOUNTER — Ambulatory Visit (HOSPITAL_BASED_OUTPATIENT_CLINIC_OR_DEPARTMENT_OTHER)
Admission: RE | Admit: 2015-03-23 | Discharge: 2015-03-23 | Disposition: A | Discharge status: Home / Self Care | Payer: Medicaid Other | Source: Ambulatory Visit | Attending: Surgery | Admitting: Surgery

## 2015-03-23 ENCOUNTER — Ambulatory Visit (HOSPITAL_BASED_OUTPATIENT_CLINIC_OR_DEPARTMENT_OTHER): Payer: Medicaid Other | Admitting: Anesthesiology

## 2015-03-23 ENCOUNTER — Encounter (HOSPITAL_BASED_OUTPATIENT_CLINIC_OR_DEPARTMENT_OTHER)
Admission: RE | Disposition: A | Discharge status: Home / Self Care | Payer: Self-pay | Source: Ambulatory Visit | Attending: Surgery

## 2015-03-23 DIAGNOSIS — Z87891 Personal history of nicotine dependence: Secondary | ICD-10-CM | POA: Insufficient documentation

## 2015-03-23 DIAGNOSIS — K644 Residual hemorrhoidal skin tags: Secondary | ICD-10-CM

## 2015-03-23 DIAGNOSIS — F329 Major depressive disorder, single episode, unspecified: Secondary | ICD-10-CM | POA: Diagnosis not present

## 2015-03-23 DIAGNOSIS — K642 Third degree hemorrhoids: Secondary | ICD-10-CM | POA: Diagnosis not present

## 2015-03-23 DIAGNOSIS — F419 Anxiety disorder, unspecified: Secondary | ICD-10-CM | POA: Insufficient documentation

## 2015-03-23 DIAGNOSIS — Z79899 Other long term (current) drug therapy: Secondary | ICD-10-CM | POA: Diagnosis not present

## 2015-03-23 HISTORY — DX: Personal history of traumatic brain injury: Z87.820

## 2015-03-23 HISTORY — PX: TRANSANAL HEMORRHOIDAL DEARTERIALIZATION: SHX6136

## 2015-03-23 HISTORY — DX: Other hemorrhoids: K64.8

## 2015-03-23 HISTORY — DX: Presence of spectacles and contact lenses: Z97.3

## 2015-03-23 HISTORY — DX: Gastro-esophageal reflux disease without esophagitis: K21.9

## 2015-03-23 HISTORY — DX: Residual hemorrhoidal skin tags: K64.4

## 2015-03-23 HISTORY — DX: Personal history of other infectious and parasitic diseases: Z86.19

## 2015-03-23 LAB — POCT HEMOGLOBIN-HEMACUE: HEMOGLOBIN: 13.6 g/dL (ref 12.0–15.0)

## 2015-03-23 LAB — POCT PREGNANCY, URINE: Preg Test, Ur: NEGATIVE

## 2015-03-23 SURGERY — TRANSANAL HEMORRHOIDAL DEARTERIALIZATION
Anesthesia: General | Site: Anus

## 2015-03-23 MED ORDER — DEXAMETHASONE SODIUM PHOSPHATE 10 MG/ML IJ SOLN
INTRAMUSCULAR | Status: AC
  Filled 2015-03-23: qty 1

## 2015-03-23 MED ORDER — SUCCINYLCHOLINE CHLORIDE 20 MG/ML IJ SOLN
INTRAMUSCULAR | Status: DC | PRN
  Administered 2015-03-23: 100 mg via INTRAVENOUS

## 2015-03-23 MED ORDER — FENTANYL CITRATE (PF) 100 MCG/2ML IJ SOLN
INTRAMUSCULAR | Status: AC
  Filled 2015-03-23: qty 2

## 2015-03-23 MED ORDER — OXYCODONE HCL 5 MG PO TABS
ORAL_TABLET | ORAL | Status: AC
  Filled 2015-03-23: qty 1

## 2015-03-23 MED ORDER — FENTANYL CITRATE (PF) 100 MCG/2ML IJ SOLN
INTRAMUSCULAR | Status: DC | PRN
  Administered 2015-03-23 (×2): 50 ug via INTRAVENOUS

## 2015-03-23 MED ORDER — LIDOCAINE HCL 4 % MT SOLN
OROMUCOSAL | Status: DC | PRN
  Administered 2015-03-23: 2 mL via TOPICAL

## 2015-03-23 MED ORDER — DEXAMETHASONE SODIUM PHOSPHATE 4 MG/ML IJ SOLN
INTRAMUSCULAR | Status: DC | PRN
  Administered 2015-03-23: 10 mg via INTRAVENOUS

## 2015-03-23 MED ORDER — PROMETHAZINE HCL 25 MG/ML IJ SOLN
6.2500 mg | INTRAMUSCULAR | Status: DC | PRN
  Filled 2015-03-23: qty 1

## 2015-03-23 MED ORDER — ONDANSETRON HCL 4 MG/2ML IJ SOLN
INTRAMUSCULAR | Status: AC
  Filled 2015-03-23: qty 2

## 2015-03-23 MED ORDER — LACTATED RINGERS IV SOLN
INTRAVENOUS | Status: DC
Start: 2015-03-23 — End: 2015-03-23
  Administered 2015-03-23: 08:00:00 via INTRAVENOUS
  Filled 2015-03-23: qty 1000

## 2015-03-23 MED ORDER — BUPIVACAINE LIPOSOME 1.3 % IJ SUSP
INTRAMUSCULAR | Status: DC | PRN
  Administered 2015-03-23: 20 mL

## 2015-03-23 MED ORDER — CHLORHEXIDINE GLUCONATE 4 % EX LIQD
1.0000 "application " | Freq: Once | CUTANEOUS | Status: DC
  Filled 2015-03-23: qty 15

## 2015-03-23 MED ORDER — SODIUM CHLORIDE 0.9 % IJ SOLN
INTRAMUSCULAR | Status: DC | PRN
  Administered 2015-03-23: 20 mL

## 2015-03-23 MED ORDER — ONDANSETRON HCL 4 MG/2ML IJ SOLN
INTRAMUSCULAR | Status: DC | PRN
  Administered 2015-03-23: 4 mg via INTRAVENOUS

## 2015-03-23 MED ORDER — BUPIVACAINE LIPOSOME 1.3 % IJ SUSP
20.0000 mL | INTRAMUSCULAR | Status: DC
  Filled 2015-03-23: qty 20

## 2015-03-23 MED ORDER — KETOROLAC TROMETHAMINE 30 MG/ML IJ SOLN
INTRAMUSCULAR | Status: AC
  Filled 2015-03-23: qty 1

## 2015-03-23 MED ORDER — BUPIVACAINE LIPOSOME 1.3 % IJ SUSP
INTRAMUSCULAR | Status: AC
  Filled 2015-03-23: qty 20

## 2015-03-23 MED ORDER — DIBUCAINE 1 % RE OINT
TOPICAL_OINTMENT | RECTAL | Status: DC | PRN
  Administered 2015-03-23: 1 via RECTAL

## 2015-03-23 MED ORDER — LIDOCAINE HCL (CARDIAC) 20 MG/ML IV SOLN
INTRAVENOUS | Status: AC
  Filled 2015-03-23: qty 5

## 2015-03-23 MED ORDER — KETOROLAC TROMETHAMINE 30 MG/ML IJ SOLN
INTRAMUSCULAR | Status: DC | PRN
  Administered 2015-03-23: 30 mg via INTRAVENOUS

## 2015-03-23 MED ORDER — NAPROXEN 500 MG PO TABS: 500.0000 mg | ORAL_TABLET | Freq: Two times a day (BID) | ORAL | Status: DC

## 2015-03-23 MED ORDER — SODIUM CHLORIDE 0.9 % IJ SOLN
INTRAMUSCULAR | Status: AC
  Filled 2015-03-23: qty 50

## 2015-03-23 MED ORDER — PROPOFOL 10 MG/ML IV BOLUS
INTRAVENOUS | Status: DC | PRN
  Administered 2015-03-23: 200 mg via INTRAVENOUS

## 2015-03-23 MED ORDER — OXYCODONE HCL 5 MG PO TABS: 5.0000 mg | ORAL_TABLET | ORAL | Status: DC | PRN

## 2015-03-23 MED ORDER — MIDAZOLAM HCL 2 MG/2ML IJ SOLN
INTRAMUSCULAR | Status: AC
  Filled 2015-03-23: qty 2

## 2015-03-23 MED ORDER — FENTANYL CITRATE (PF) 100 MCG/2ML IJ SOLN
25.0000 ug | INTRAMUSCULAR | Status: DC | PRN
  Administered 2015-03-23: 25 ug via INTRAVENOUS
  Filled 2015-03-23: qty 1

## 2015-03-23 MED ORDER — LIDOCAINE-EPINEPHRINE (PF) 1 %-1:200000 IJ SOLN
INTRAMUSCULAR | Status: DC | PRN
  Administered 2015-03-23: 30 mL

## 2015-03-23 MED ORDER — OXYCODONE HCL 5 MG PO TABS
5.0000 mg | ORAL_TABLET | ORAL | Status: DC | PRN
  Administered 2015-03-23: 5 mg via ORAL
  Filled 2015-03-23: qty 1

## 2015-03-23 MED ORDER — PROPOFOL 10 MG/ML IV BOLUS
INTRAVENOUS | Status: AC
  Filled 2015-03-23: qty 20

## 2015-03-23 MED ORDER — LIDOCAINE-EPINEPHRINE (PF) 1 %-1:200000 IJ SOLN
INTRAMUSCULAR | Status: AC
  Filled 2015-03-23: qty 30

## 2015-03-23 MED ORDER — CHLORHEXIDINE GLUCONATE 4 % EX LIQD
1.0000 "application " | Freq: Once | CUTANEOUS | Status: AC
  Administered 2015-03-23: 1 via TOPICAL
  Filled 2015-03-23: qty 15

## 2015-03-23 MED ORDER — MIDAZOLAM HCL 5 MG/5ML IJ SOLN
INTRAMUSCULAR | Status: DC | PRN
  Administered 2015-03-23: 2 mg via INTRAVENOUS

## 2015-03-23 MED ORDER — LIDOCAINE HCL (CARDIAC) 20 MG/ML IV SOLN
INTRAVENOUS | Status: DC | PRN
  Administered 2015-03-23: 60 mg via INTRAVENOUS

## 2015-03-23 SURGICAL SUPPLY — 51 items
BLADE SURG 15 STRL LF DISP TIS (BLADE) ×1 IMPLANT
BLADE SURG 15 STRL SS (BLADE) ×2
BRIEF STRETCH FOR OB PAD LRG (UNDERPADS AND DIAPERS) ×3 IMPLANT
CANISTER SUCTION 1200CC (MISCELLANEOUS) ×3 IMPLANT
CANISTER SUCTION 2500CC (MISCELLANEOUS) ×3 IMPLANT
CLEANER CAUTERY TIP 5X5 PAD (MISCELLANEOUS) ×1 IMPLANT
COVER BACK TABLE 60X90IN (DRAPES) ×3 IMPLANT
COVER MAYO STAND STRL (DRAPES) ×3 IMPLANT
DECANTER SPIKE VIAL GLASS SM (MISCELLANEOUS) ×3 IMPLANT
DRAPE LG THREE QUARTER DISP (DRAPES) ×3 IMPLANT
DRSG PAD ABDOMINAL 8X10 ST (GAUZE/BANDAGES/DRESSINGS) ×3 IMPLANT
ELECT REM PT RETURN 9FT ADLT (ELECTROSURGICAL) ×3
ELECTRODE REM PT RTRN 9FT ADLT (ELECTROSURGICAL) ×1 IMPLANT
GAUZE SPONGE 4X4 12PLY STRL (GAUZE/BANDAGES/DRESSINGS) ×3 IMPLANT
GLOVE BIO SURGEON STRL SZ 6.5 (GLOVE) ×2 IMPLANT
GLOVE BIO SURGEONS STRL SZ 6.5 (GLOVE) ×1
GLOVE BIOGEL PI IND STRL 8 (GLOVE) ×1 IMPLANT
GLOVE BIOGEL PI INDICATOR 8 (GLOVE) ×2
GLOVE ECLIPSE 8.0 STRL XLNG CF (GLOVE) ×3 IMPLANT
GLOVE INDICATOR 6.5 STRL GRN (GLOVE) ×6 IMPLANT
GLOVE INDICATOR 7.0 STRL GRN (GLOVE) ×3 IMPLANT
GOWN STRL REUS W/ TWL XL LVL3 (GOWN DISPOSABLE) ×1 IMPLANT
GOWN STRL REUS W/TWL XL LVL3 (GOWN DISPOSABLE) ×2
KIT ROOM TURNOVER WOR (KITS) ×3 IMPLANT
LEGGING LITHOTOMY PAIR STRL (DRAPES) ×3 IMPLANT
NEEDLE HYPO 22GX1.5 SAFETY (NEEDLE) ×3 IMPLANT
NEEDLE HYPO 25X1 1.5 SAFETY (NEEDLE) ×3 IMPLANT
NS IRRIG 500ML POUR BTL (IV SOLUTION) ×3 IMPLANT
PACK BASIN DAY SURGERY FS (CUSTOM PROCEDURE TRAY) ×3 IMPLANT
PAD ABD 8X10 STRL (GAUZE/BANDAGES/DRESSINGS) ×3 IMPLANT
PAD CLEANER CAUTERY TIP 5X5 (MISCELLANEOUS) ×2
PENCIL BUTTON HOLSTER BLD 10FT (ELECTRODE) ×3 IMPLANT
SPONGE GAUZE 4X4 12PLY (GAUZE/BANDAGES/DRESSINGS) IMPLANT
SPONGE SURGIFOAM ABS GEL 100 (HEMOSTASIS) IMPLANT
SPONGE SURGIFOAM ABS GEL 12-7 (HEMOSTASIS) IMPLANT
SUT CHROMIC 3 0 SH 27 (SUTURE) ×3 IMPLANT
SUT VIC AB 2-0 SH 27 (SUTURE) ×2
SUT VIC AB 2-0 SH 27XBRD (SUTURE) ×1 IMPLANT
SUT VIC AB 2-0 UR6 27 (SUTURE) ×18 IMPLANT
SUT VICRYL AB 2 0 TIE (SUTURE) ×1 IMPLANT
SUT VICRYL AB 2 0 TIES (SUTURE) ×2
SYR CONTROL 10ML LL (SYRINGE) ×3 IMPLANT
TAPE CLOTH SURG 4X10 WHT LF (GAUZE/BANDAGES/DRESSINGS) ×3 IMPLANT
TOWEL OR 17X24 6PK STRL BLUE (TOWEL DISPOSABLE) ×3 IMPLANT
TOWEL OR NON WOVEN STRL DISP B (DISPOSABLE) ×3 IMPLANT
TRAY DSU PREP LF (CUSTOM PROCEDURE TRAY) ×3 IMPLANT
TUBE CONNECTING 12'X1/4 (SUCTIONS) ×1
TUBE CONNECTING 12X1/4 (SUCTIONS) ×2 IMPLANT
UNDERPAD 30X30 INCONTINENT (UNDERPADS AND DIAPERS) ×3 IMPLANT
WATER STERILE IRR 500ML POUR (IV SOLUTION) ×3 IMPLANT
YANKAUER SUCT BULB TIP NO VENT (SUCTIONS) ×3 IMPLANT

## 2015-03-23 NOTE — Anesthesia Preprocedure Evaluation (Signed)
Anesthesia Evaluation  Patient identified by MRN, date of birth, ID band Patient awake    Reviewed: Allergy & Precautions, NPO status , Patient's Chart, lab work & pertinent test results  Airway Mallampati: II  TM Distance: >3 FB Neck ROM: Full    Dental no notable dental hx.    Pulmonary neg pulmonary ROS, former smoker,    Pulmonary exam normal breath sounds clear to auscultation       Cardiovascular negative cardio ROS Normal cardiovascular exam Rhythm:Regular Rate:Normal     Neuro/Psych Anxiety negative neurological ROS     GI/Hepatic negative GI ROS, Neg liver ROS,   Endo/Other  negative endocrine ROS  Renal/GU negative Renal ROS  negative genitourinary   Musculoskeletal negative musculoskeletal ROS (+)   Abdominal   Peds negative pediatric ROS (+)  Hematology negative hematology ROS (+)   Anesthesia Other Findings   Reproductive/Obstetrics negative OB ROS                             Anesthesia Physical Anesthesia Plan  ASA: I  Anesthesia Plan: General   Post-op Pain Management:    Induction: Intravenous  Airway Management Planned: Oral ETT  Additional Equipment:   Intra-op Plan:   Post-operative Plan: Extubation in OR  Informed Consent: I have reviewed the patients History and Physical, chart, labs and discussed the procedure including the risks, benefits and alternatives for the proposed anesthesia with the patient or authorized representative who has indicated his/her understanding and acceptance.   Dental advisory given  Plan Discussed with: CRNA and Surgeon  Anesthesia Plan Comments:         Anesthesia Quick Evaluation

## 2015-03-23 NOTE — Discharge Instructions (Signed)
ANORECTAL SURGERY:  POST OPERATIVE INSTRUCTIONS  1. Take your usually prescribed home medications unless otherwise directed. 2. DIET: Follow a light bland diet the first 24 hours after arrival home, such as soup, liquids, crackers, etc.  Be sure to include lots of fluids daily.  Avoid fast food or heavy meals as your are more likely to get nauseated.  Eat a low fat the next few days after surgery.   3. PAIN CONTROL: a. Pain is best controlled by a usual combination of three different methods TOGETHER: i. Ice/Heat ii. Over the counter pain medication iii. Prescription pain medication b. Most patients will experience some swelling and discomfort in the anus/rectal area. and incisions.  Ice packs or heat (30-60 minutes up to 6 times a day) will help. Use ice for the first few days to help decrease swelling and bruising, then switch to heat such as warm towels, sitz baths, warm baths, etc to help relax tight/sore spots and speed recovery.  Some people prefer to use ice alone, heat alone, alternating between ice & heat.  Experiment to what works for you.  Swelling and bruising can take several weeks to resolve.   c. It is helpful to take an over-the-counter pain medication regularly for the first few weeks.  Choose one of the following that works best for you: i. Naproxen (Aleve, etc)  Two 232m tabs twice a day ii. Ibuprofen (Advil, etc) Three 2017mtabs four times a day (every meal & bedtime) iii. Acetaminophen (Tylenol, etc) 500-65058mour times a day (every meal & bedtime) d. A  prescription for pain medication (such as oxycodone, hydrocodone, etc) should be given to you upon discharge.  Take your pain medication as prescribed.  i. If you are having problems/concerns with the prescription medicine (does not control pain, nausea, vomiting, rash, itching, etc), please call us [phone] see if we need to switch you to a different pain medicine that will work better for you and/or control your  side effect better. ii. If you need a refill on your pain medication, please contact your pharmacy.  They will contact our office to request authorization. Prescriptions will not be filled after 5 pm or on week-ends.  Use a Sitz Bath 4-8 times a day for relief   [employer] bath is a warm water bath taken in the sitting position that covers only the hips and buttocks. It may be used for either healing or hygiene purposes. Sitz baths are also used to relieve pain, itching, or muscle spasms. The water may contain medicine. Moist heat will help you heal and relax.  HOME CARE INSTRUCTIONS  Take 3 to 4 sitz baths a day. 1. Fill the bathtub half full with warm water. 2. Sit in the water and open the drain a little. 3. Turn on the warm water to keep the tub half full. Keep the water running constantly. 4. Soak in the water for 15 to 20 minutes. 5. After the sitz bath, pat the affected area dry first.   4. KEEP YOUR BOWELS REGULAR a. The goal is one bowel movement a day b. Avoid getting constipated.  Between the surgery and the pain medications, it is common to experience some constipation.  Increasing fluid intake and taking a fiber supplement (such as Metamucil, Citrucel, FiberCon, MiraLax, etc) 1-2 times a day regularly will usually help prevent this problem from occurring.  A mild laxative (prune juice, Milk of Magnesia, MiraLax, etc) should be taken according to package  directions if there are no bowel movements after 48 hours. °c. Watch out for diarrhea.  If you have many loose bowel movements, simplify your diet to bland foods & liquids for a few days.  Stop any stool softeners and decrease your fiber supplement.  Switching to mild anti-diarrheal medications (Kayopectate, Pepto Bismol) can help.  If this worsens or does not improve, please call us. ° °5. Wound Care °a. Remove your bandages the day after surgery.  Unless discharge instructions indicate otherwise, leave your bandage dry and in  place overnight.  Remove the bandage during your first bowel movement.   °b. Allow the wound packing to fall out over the next few days.  You can trim exposed gauze / ribbon as it falls out.  You do not need to repack the wound unless instructed otherwise.  Wear an absorbent pad or soft cotton gauze in your underwear as needed to catch any drainage and help keep the area  °c. Keep the area clean and dry.  Bathe / shower every day.  Keep the area clean by showering / bathing over the incision / wound.   It is okay to soak an open wound to help wash it.  Wet wipes or showers / gentle washing after bowel movements is often less traumatic than regular toilet paper. °d. You may have some styrofoam-like soft packing in the rectum which will come out with the first bowel movement.  °e. You will often notice bleeding with bowel movements.  This should slow down by the end of the first week of surgery °f. Expect some drainage.  This should slow down, too, by the end of the first week of surgery.  Wear an absorbent pad or soft cotton gauze in your underwear until the drainage stops. °6. ACTIVITIES as tolerated:   °a. You may resume regular (light) daily activities beginning the next day--such as daily self-care, walking, climbing stairs--gradually increasing activities as tolerated.  If you can walk 30 minutes without difficulty, it is safe to try more intense activity such as jogging, treadmill, bicycling, low-impact aerobics, swimming, etc. °b. Save the most intensive and strenuous activity for last such as sit-ups, heavy lifting, contact sports, etc  Refrain from any heavy lifting or straining until you are off narcotics for pain control.   °c. DO NOT PUSH THROUGH PAIN.  Let pain be your guide: If it hurts to do something, don't do it.  Pain is your body warning you to avoid that activity for another week until the pain goes down. °d. You may drive when you are no longer taking prescription pain medication, you can  comfortably sit for long periods of time, and you can safely maneuver your car and apply brakes. °e. You may have sexual intercourse when it is comfortable.  °7. FOLLOW UP in our office °a. Please call [facility] at [phone] to set up an appointment to see your surgeon in the office for a follow-up appointment approximately 2 weeks after your surgery. °b. Make sure that you call for this appointment the day you arrive home to insure a convenient appointment time. °10. IF YOU HAVE DISABILITY OR FAMILY LEAVE FORMS, BRING THEM TO THE OFFICE FOR PROCESSING.  DO NOT GIVE THEM TO YOUR DOCTOR. ° ° ° ° ° ° ° °WHEN TO CALL US [phone]: °1. Poor pain control °2. Reactions / problems with new medications (rash/itching, nausea, etc)  °3. Fever over 101.5 F (38.5 C) °4. Inability to urinate °5. Nausea and/or   vomiting 6. Worsening swelling or bruising 7. Continued bleeding from incision. 8. Increased pain, redness, or drainage from the incision  The clinic staff is available to answer your questions during regular business hours (8:30am-5pm).  Please dont hesitate to call and ask to speak to one of our nurses for clinical concerns.   A surgeon from [facility] is always on call at the hospitals   If you have a medical emergency, go to the nearest emergency room or call 911.    [facility] [address], [address], [place], [place]  [address] ? MAIN: [phone] ? TOLL FREE: [phone] ? FAX [phone] [email or web address]  GETTING TO GOOD BOWEL HEALTH. Irregular bowel habits such as constipation and diarrhea can lead to many problems over time.  Having one soft bowel movement a day is the most important way to prevent further problems.  The anorectal canal is designed to handle stretching and feces to safely manage our ability to get rid of solid waste (feces, poop, stool) out of our body.  BUT, hard constipated stools can act like ripping concrete  bricks and diarrhea can be a burning fire to this very sensitive area of our body, causing inflamed hemorrhoids, anal fissures, increasing risk is perirectal abscesses, abdominal pain/bloating, an making irritable bowel worse.      The goal: ONE SOFT BOWEL MOVEMENT A DAY!  To have soft, regular bowel movements:   Drink plenty of fluids, consider 4-6 tall glasses of water a day.    Take plenty of fiber.  Fiber is the undigested part of plant food that passes into the colon, acting s natures broom to encourage bowel motility and movement.  Fiber can absorb and hold large amounts of water. This results in a larger, bulkier stool, which is soft and easier to pass. Work gradually over several weeks up to 6 servings a day of fiber (25g a day even more if needed) in the form of: o Vegetables -- Root (potatoes, carrots, turnips), leafy green (lettuce, salad greens, celery, spinach), or cooked high residue (cabbage, broccoli, etc) o Fruit -- Fresh (unpeeled skin & pulp), Dried (prunes, apricots, cherries, etc ),  or stewed ( applesauce)  o Whole grain breads, pasta, etc (whole wheat)  o Bran cereals   Bulking Agents -- This type of water-retaining fiber generally is easily obtained each day by one of the following:  o Psyllium bran -- The psyllium plant is remarkable because its ground seeds can retain so much water. This product is available as Metamucil, Konsyl, Effersyllium, Per Diem Fiber, or the less expensive generic preparation in drug and health food stores. Although labeled a laxative, it really is not a laxative.  o Methylcellulose -- This is another fiber derived from wood which also retains water. It is available as Citrucel. o Polyethylene Glycol - and artificial fiber commonly called Miralax or Glycolax.  It is helpful for people with gassy or bloated feelings with regular fiber o Flax Seed - a less gassy fiber than psyllium  No reading or other relaxing activity while on the toilet. If  bowel movements take longer than 5 minutes, you are too constipated  AVOID CONSTIPATION.  High fiber and water intake usually takes care of this.  Sometimes a laxative is needed to stimulate more frequent bowel movements, but   Laxatives are not a good long-term solution as it can wear the colon out.  They can help jump-start bowels if constipated, but should be relied  on constantly without discussing with your doctor o Osmotics (Milk of Magnesia, Fleets phosphosoda, Magnesium citrate, MiraLax, GoLytely) are safer than  o Stimulants (Senokot, Castor Oil, Dulcolax, Ex Lax)    o Avoid taking laxatives for more than 7 days in a row.   IF SEVERELY CONSTIPATED, try a Bowel Retraining Program: o Do not use laxatives.  o Eat a diet high in roughage, such as bran cereals and leafy vegetables.  o Drink six (6) ounces of prune or apricot juice each morning.  o Eat two (2) large servings of stewed fruit each day.  o Take one (1) heaping tablespoon of a psyllium-based bulking agent twice a day. Use sugar-free sweetener when possible to avoid excessive calories.  o Eat a normal breakfast.  o Set aside 15 minutes after breakfast to sit on the toilet, but do not strain to have a bowel movement.  o If you do not have a bowel movement by the third day, use an enema and repeat the above steps.   Controlling diarrhea o Switch to liquids and simpler foods for a few days to avoid stressing your intestines further. o Avoid dairy products (especially milk & ice cream) for a short time.  The intestines often can lose the ability to digest lactose when stressed. o Avoid foods that cause gassiness or bloating.  Typical foods include beans and other legumes, cabbage, broccoli, and dairy foods.  Every person has some sensitivity to other foods, so listen to our body and avoid those foods that trigger problems for you. o Adding fiber (Citrucel, Metamucil, psyllium, Miralax) gradually can help thicken stools by absorbing  excess fluid and retrain the intestines to act more normally.  Slowly increase the dose over a few weeks.  Too much fiber too soon can backfire and cause cramping & bloating. o Probiotics (such as active yogurt, Align, etc) may help repopulate the intestines and colon with normal bacteria and calm down a sensitive digestive tract.  Most studies show it to be of mild help, though, and such products can be costly. o Medicines: - Bismuth subsalicylate (ex. Kayopectate, Pepto Bismol) every 30 minutes for up to 6 doses can help control diarrhea.  Avoid if pregnant. - Loperamide (Immodium) can slow down diarrhea.  Start with two tablets (  total) first and then try one tablet every 6 hours.  Avoid if you are having fevers or severe pain.  If you are not better or start feeling worse, stop all medicines and call your doctor for advice o Call your doctor if you are getting worse or not better.  Sometimes further testing (cultures, endoscopy, X-ray studies, bloodwork, etc) may be needed to help diagnose and treat the cause of the diarrhea.  TROUBLESHOOTING IRREGULAR BOWELS 1) Avoid extremes of bowel movements (no bad constipation/diarrhea) 2) Miralax 17gm mixed in 8oz. water or juice-daily. May use BID as needed.  3) Gas-x,Phazyme, etc. as needed for gas & bloating.  4) Soft,bland diet. No spicy,greasy,fried foods.  5) Prilosec over-the-counter as needed  6) May hold gluten/wheat products from diet to see if symptoms improve.  7)  May try probiotics (Align, Activa, etc) to help calm the bowels down 7) If symptoms become worse call back immediately.  Managing Pain  Pain after surgery or related to activity is often due to strain/injury to muscle, tendon, nerves and/or incisions.  This pain is usually short-term and will improve in a few months.   Many people find it helpful to do the following things TOGETHER  to help speed the process of healing and to get back to regular activity more  quickly:  1. Avoid heavy physical activity at first a. No lifting greater than 20 pounds at first, then increase to lifting as tolerated over the next few weeks b. Do not push through the pain.  Listen to your body and avoid positions and maneuvers than reproduce the pain.  Wait a few days before trying something more intense c. Walking is okay as tolerated, but go slowly and stop when getting sore.  If you can walk 30 minutes without stopping or pain, you can try more intense activity (running, jogging, aerobics, cycling, swimming, treadmill, sex, sports, weightlifting, etc ) d. Remember: If it hurts to do it, then dont do it!  2. Take Anti-inflammatory medication a. Choose ONE of the following over-the-counter medications: i.            Acetaminophen  tabs (Tylenol) 1-2 pills with every meal and just before bedtime (avoid if you have liver problems) ii.            Naproxen  tabs (ex. Aleve) 1-2 pills twice a day (avoid if you have kidney, stomach, IBD, or bleeding problems) iii. Ibuprofen  tabs (ex. Advil, Motrin) 3-4 pills with every meal and just before bedtime (avoid if you have kidney, stomach, IBD, or bleeding problems) b. Take with food/snack around the clock for 1-2 weeks i. This helps the muscle and nerve tissues become less irritable and calm down faster  3. Use a Heating pad or Ice/Cold Pack a. 4-6 times a day b. May use warm bath/hottub  or showers  4. Try Gentle Massage and/or Stretching  a. at the area of pain many times a day b. stop if you feel pain - do not overdo it  Try these steps together to help you body heal faster and avoid making things get worse.  Doing just one of these things may not be enough.    If you are not getting better after two weeks or are noticing you are getting worse, contact our office for further advice; we may need to re-evaluate you & see what other things we can do to help.  HEMORRHOIDS  The rectum is the last foot of your  colon, and it naturally stretches to hold stool.  Hemorrhoidal piles are natural clusters of blood vessels that help the rectum and anal canal stretch to hold stool and allow bowel movements to eliminate feces.   Hemorrhoids are abnormally swollen blood vessels in the rectum.  Too much pressure in the rectum causes hemorrhoids by forcing blood to stretch and bulge the walls of the veins, sometimes even rupturing them.  Hemorrhoids can become like varicose veins you might see on a person's legs.  Most people will develop a flare of hemorrhoids in their lifetime.  When bulging hemorrhoidal veins are irritated, they can swell, burn, itch, cause pain, and bleed.  Most flares will calm down gradually own within a few weeks.  However, once hemorrhoids are created, they are difficult to get rid of completely and tend to flare more easily than the first flare.   Fortunately, good habits and simple medical treatment usually control hemorrhoids well, and surgery is needed only in severe cases. Types of Hemorrhoids:  Internal hemorrhoids usually don't initially hurt or itch; they are deep inside the rectum and usually have no sensation. If they begin to push out (prolapse), pain and burning can occur.  However, internal hemorrhoids can bleed.  Anal bleeding should not be ignored since bleeding could come from a dangerous source like colorectal cancer, so persistent rectal bleeding should be investigated by a doctor, sometimes with a colonoscopy.  External hemorrhoids cause most of the symptoms - pain, burning, and itching. Nonirritated hemorrhoids can look like small skin tags coming out of the anus.   Thrombosed hemorrhoids can form when a hemorrhoid blood vessel bursts and causes the hemorrhoid to suddenly swell.  A purple blood clot can form in it and become an excruciatingly painful lump at the anus. Because of these unpleasant symptoms, immediate incision and drainage by a surgeon at an office visit can provide much  relief of the pain.    PREVENTION Avoiding the most frequent causes listed below will prevent most cases of hemorrhoids: Constipation Hard stools Diarrhea  Constant sitting  Straining with bowel movements Sitting on the toilet for a long time  Severe coughing  episodes Pregnancy / Childbirth  Heavy Lifting  Sometimes avoiding the above triggers is difficult:  How can you avoid sitting all day if you have a seated job? Also, we try to avoid coughing and diarrhea, but sometimes its beyond your control.  Still, there are some practical hints to help: Keep the anal and genital area clean.  Moistened tissues such as flushable wet wipes are less irritating than toilet paper.  Using irrigating showers or bottle irrigation washing gently cleans this sensitive area.   Avoid dry toilet paper when cleaning after bowel movements.  Marland Kitchen Keep the anal and genital area dry.  Lightly pat the rectal area dry.  Avoid rubbing.  Talcum or baby powders can help GET YOUR STOOLS SOFT.   This is the most important way to prevent irritated hemorrhoids.  Hard stools are like sandpaper to the anorectal canal and will cause more problems.  The goal: ONE SOFT BOWEL MOVEMENT A DAY!  BMs from every other day to 3 times a day is a tolerable range Treat coughing, diarrhea and constipation early since irritated hemorrhoids may soon follow.  If your main job activity is seated, always stand or walk during your breaks. Make it a point to stand and walk at least 5 minutes every hour and try to shift frequently in your chair to avoid direct rectal pressure.  Always exhale as you strain or lift. Don't hold your breath.  Do not delay or try to prevent a bowel movement when the urge is present. Exercise regularly (walking or jogging 60 minutes a day) to stimulate the bowels to move. No reading or other activity while on the toilet. If bowel movements take longer than 5 minutes, you are too constipated. AVOID CONSTIPATION Drink plenty  of liquids (1 1/2 to 2 quarts of water and other fluids a day unless fluid restricted for another medical condition). Liquids that contain caffeine (coffee a, tea, soft drinks) can be dehydrating and should be avoided until constipation is controlled. Consider minimizing milk, as dairy products may be constipating. Eat plenty of fiber (30g a day ideal, more if needed).  Fiber is the undigested part of plant food that passes into the colon, acting as natures broom to encourage bowel motility and movement.  Fiber can absorb and hold large amounts of water. This results in a larger, bulkier stool, which is soft and easier to pass.  Eating foods high in fiber - 12 servings - such as  Vegetables: Root (potatoes, carrots, turnips), Leafy green (lettuce, salad greens, celery, spinach), High residue (cabbage, broccoli, etc.) Fruit:  Fresh, Dried (prunes, apricots, cherries), Stewed (applesauce)  Whole grain breads, pasta, whole wheat Bran cereals, muffins, etc. Consider adding supplemental bulking fiber which retains large volumes of water: Psyllium ground seeds (native plant from [place])--available as Metamucil, Konsyl, Effersyllium, Per Diem Fiber, or the less expensive generic forms.  Citrucel  (methylcellulose wood fiber) . FiberCon (Polycarbophil) Polyethylene Glycol - and artificial fiber commonly called Miralax or Glycolax.  It is helpful for people with gassy or bloated feelings with regular fiber Flax Seed - a less gassy natural fiber  Laxatives can be useful for a short period if constipation is severe Osmotics (Milk of Magnesia, Fleets Phospho-Soda, Magnesium Citrate)  Stimulants (Senokot,   Castor Oil,  Dulcolax, Ex-Lax)    Laxatives are not a good long-term solution as it can stress the bowels and cause too much mineral loss and dehydration.   Avoid taking laxatives for more than 7 days in a row.  AVOID DIARRHEA Switch to liquids and simpler foods for a few days to avoid stressing  your intestines further. Avoid dairy products (especially milk & ice cream) for a short time.  The intestines often can lose the ability to digest lactose when stressed. Avoid foods that cause gassiness or bloating.  Typical foods include beans and other legumes, cabbage, broccoli, and dairy foods.  Every person has some sensitivity to other foods, so listen to your body and avoid those foods that trigger problems for you. Adding fiber (Citrucel, Metamucil, FiberCon, Flax seed, Miralax) gradually can help thicken stools by absorbing excess fluid and retrain the intestines to act more normally.  Slowly increase the dose over a few weeks.  Too much fiber too soon can backfire and cause cramping & bloating. Probiotics (such as active yogurt, Align, etc) may help repopulate the intestines and colon with normal bacteria and calm down a sensitive digestive tract.  Most studies show it to be of mild help, though, and such products can be costly. Medicines: Bismuth subsalicylate (ex. Kayopectate, Pepto Bismol) every 30 minutes for up to 6 doses can help control diarrhea.  Avoid if pregnant. Loperamide (Immodium) can slow down diarrhea.  Start with two tablets (4mg  total) first and then try one tablet every 6 hours.  Avoid if you are having fevers or severe pain.  If you are not better or start feeling worse, stop all medicines and call your doctor for advice Call your doctor if you are getting worse or not better.  Sometimes further testing (cultures, endoscopy, X-ray studies, bloodwork, etc) may be needed to help diagnose and treat the cause of the diarrhea.  TROUBLESHOOTING IRREGULAR BOWELS 1) Avoid extremes of bowel movements (no bad constipation/diarrhea) 2) Miralax 17gm mixed in 8oz. water or juice-daily. May use BID as needed.  3) Gas-x,Phazyme, etc. as needed for gas & bloating.  4) Soft,bland diet. No spicy,greasy,fried foods.  5) Prilosec over-the-counter as needed  6) May hold gluten/wheat products  from diet to see if symptoms improve.  7)  May try probiotics (Align, Activa, etc) to help calm the bowels down 7) If symptoms become worse call back immediately.   TREATMENT OF HEMORRHOID FLARE If these preventive measures fail, you must take action right away! Hemorrhoids are one condition that can be mild in the morning and become intolerable by nightfall. Most hemorrhoidal flares take several weeks to calm down.  These suggestions can help: Warm soaks.  This helps more than any topical medication.  Use up to 8 times a day.  Usually sitz baths  or sitting in a warm bathtub helps.  Sitting on moist warm towels are helpful.  Switching to ice packs/cool compresses can be helpful  Use a Sitz Bath 4-8 times a day for relief A sitz bath is a warm water bath taken in the sitting position that covers only the hips and buttocks. It may be used for either healing or hygiene purposes. Sitz baths are also used to relieve pain, itching, or muscle spasms. The water may contain medicine. Moist heat will help you heal and relax.  HOME CARE INSTRUCTIONS  Take 3 to 4 sitz baths a day. 6. Fill the bathtub half full with warm water. 7. Sit in the water and open the drain a little. 8. Turn on the warm water to keep the tub half full. Keep the water running constantly. 9. Soak in the water for 15 to 20 minutes. 10. After the sitz bath, pat the affected area dry first. SEEK MEDICAL CARE IF:  You get worse instead of better. Stop the sitz baths if you get worse.  Normalize your bowels.  Extremes of diarrhea or constipation will make hemorrhoids worse.  One soft bowel movement a day is the goal.  Fiber can help get your bowels regular Wet wipes instead of toilet paper Pain control with a NSAID such as ibuprofen (Advil) or naproxen (Aleve) or acetaminophen (Tylenol) around the clock.  Narcotics are constipating and should be minimized if possible Topical creams contain steroids (bydrocortisone) or local anesthetic  (xylocaine) can help make pain and itching more tolerable.   EVALUATION If hemorrhoids are still causing problems, you could benefit by an evaluation by a surgeon.  The surgeon will obtain a history and examine you.  If hemorrhoids are diagnosed, some therapies can be offered in the office, usually with an anoscope into the less sensitive area of the rectum: -injection of hemorrhoids (sclerotherapy) can scar the blood vessels of the swollen/enlarged hemorrhoids to help shrink them down to a more normal size -rubber banding of the enlarged hemorrhoids to help shrink them down to a more normal size -drainage of the blood clot causing a thrombosed hemorrhoid,  to relieve the severe pain   While 90% of the time such problems from hemorrhoids can be managed without preceding to surgery, sometimes the hemorrhoids require a operation to control the problem (uncontrolled bleeding, prolapse, pain, etc.).   This involves being placed under general anesthesia where the surgeon can confirm the diagnosis and remove, suture, or staple the hemorrhoid(s).  Your surgeon can help you treat the problem appropriately.    Information for Discharge Teaching: EXPAREL (bupivacaine liposome injectable suspension)   Your surgeon gave you EXPAREL(bupivacaine) in your surgical incision to help control your pain after surgery.   EXPAREL is a local anesthetic that provides pain relief by numbing the tissue around the surgical site.  EXPAREL is designed to release pain medication over time and can control pain for up to 72 hours.  Depending on how you respond to EXPAREL, you may require less pain medication during your recovery.  Possible side effects:  Temporary loss of sensation or ability to move in the area where bupivacaine was injected.  Nausea, vomiting, constipation  Rarely, numbness and tingling in your mouth or lips, lightheadedness, or anxiety may occur.  Call your doctor right away if you think you may be  experiencing any of these sensations, or if you have other questions regarding possible side effects.  Follow all other discharge instructions given to you by your  surgeon or nurse. Eat a healthy diet and drink plenty of water or other fluids.  If you return to the hospital for any reason within 96 hours following the administration of EXPAREL, please inform your health care providers.   Post Anesthesia Home Care Instructions  Activity: Get plenty of rest for the remainder of the day. A responsible adult should stay with you for 24 hours following the procedure.  For the next 24 hours, DO NOT: -Drive a car -Advertising copywriter -Drink alcoholic beverages -Take any medication unless instructed by your physician -Make any legal decisions or sign important papers.  Meals: Start with liquid foods such as gelatin or soup. Progress to regular foods as tolerated. Avoid greasy, spicy, heavy foods. If nausea and/or vomiting occur, drink only clear liquids until the nausea and/or vomiting subsides. Call your physician if vomiting continues.  Special Instructions/Symptoms: Your throat may feel dry or sore from the anesthesia or the breathing tube placed in your throat during surgery. If this causes discomfort, gargle with warm salt water. The discomfort should disappear within 24 hours.  If you had a scopolamine patch placed behind your ear for the management of post- operative nausea and/or vomiting:  1. The medication in the patch is effective for 72 hours, after which it should be removed.  Wrap patch in a tissue and discard in the trash. Wash hands thoroughly with soap and water. 2. You may remove the patch earlier than 72 hours if you experience unpleasant side effects which may include dry mouth, dizziness or visual disturbances. 3. Avoid touching the patch. Wash your hands with soap and water after contact with the patch.

## 2015-03-23 NOTE — Transfer of Care (Signed)
Immediate Anesthesia Transfer of Care Note  Patient: [name]  Procedure(s) Performed: Procedure(s) (LRB): EXAM UNDER ANESTHESIA LIGATION AND PEXY INTERNAL HEMORRHOIDS EXCISION SYMPTOMATIC EXTERNAL HEMORRHOIDS (N/A)  Patient Location: PACU  Anesthesia Type: General  Level of Consciousness: awake, oriented, sedated and patient cooperative  Airway & Oxygen Therapy: Patient Spontanous Breathing and Patient connected to face mask oxygen  Post-op Assessment: Report given to PACU RN and Post -op Vital signs reviewed and stable  Post vital signs: Reviewed and stable  Complications: No apparent anesthesia complications

## 2015-03-23 NOTE — H&P (View-Only) (Signed)
[name] 12/30/2014 8:41 AM Location: [facility] Patient #: [id number] DOB: 04/07/1975 Single / Language: English / Race: White Female   History of Present Illness (Jame Seelig C. Mara Favero MD; 12/30/2014 9:33 AM) Patient words: hems.  The patient is a 40 year old female who presents with hemorrhoids. Patient sent by her gynecologist, Dr. Dyanna Clark, for concern of symptomatic external hemorrhoids  Pleasant healthy active woman. Has struggled with hemorrhoids for decades. Think they were mild in the teenage years. Urgently worsened with her first pregnancy in her early twenties. She struggles with intermittent flares of swelling pain and bleeding. Worsening over the past year or so. She feels external hemorrhoids all the time. They calm down but still itching and burning and occasionally bleed. Using wet wipes. Topical medications no longer work. Blood when she wipes most of the time. Feels hemorrhoid popping out often. Sounds like sometimes thrombosed. Had worsening flare. Mentioned at her gynecologist. Surgical consultation offered.  Patient denies any history of Crohn's, ulcerative colitis, inflammatory bowel disease. No family history of bowel problems. No colon polyps or cancer. Has a bowel movement every day. Has intermittently used MiraLAX but confesses she's not been can to compliant on it. She's never had any injections banding or hemorrhoid surgery. She did have an episiotomy from vaginal delivery in the past but no issues of fecal incontinence to or incontinence to flatus. No urinary incontinence or other issues. No urinary tract infections. She is otherwise rather active.   Other Problems (Ashley Beck, CMA; 12/30/2014 8:41 AM) Anxiety Disorder Depression Hemorrhoids Migraine Headache  Past Surgical History (Ashley Beck, CMA; 12/30/2014 8:41 AM) Foot Surgery Bilateral. Oral Surgery  Diagnostic Studies History (Ashley Beck, CMA;  12/30/2014 8:41 AM) Colonoscopy never Mammogram never Pap Smear 1-5 years ago  Allergies (Ashley Beck, CMA; 12/30/2014 8:42 AM) Levaquin *Fluoroquinolones**  Medication History (Ashley Beck, CMA; 12/30/2014 8:43 AM) Sprintec 28 (0.25-35MG-MCG Tablet, Oral) Active. Promethazine HCl (25MG Tablet, Oral) Active. Lexapro (5MG Tablet, Oral) Active. Multiple Vitamin (Oral) Active. Medications Reconciled  Social History (Ashley Beck, CMA; 12/30/2014 8:41 AM) Alcohol use Occasional alcohol use. Caffeine use Carbonated beverages, Coffee, Tea. No drug use Tobacco use Former smoker.  Family History (Ashley Beck, CMA; 12/30/2014 8:41 AM) Depression Sister. Hypertension Mother. Migraine Headache Father.  Pregnancy / Birth History (Ashley Beck, CMA; 12/30/2014 8:41 AM) Age at menarche 15 years. Contraceptive History Oral contraceptives. Gravida 4 Maternal age 15-20 Para 3 Regular periods    Review of Systems (Ashley Beck CMA; 12/30/2014 8:41 AM) General Not Present- Appetite Loss, Chills, Fatigue, Fever, Night Sweats, Weight Gain and Weight Loss. Skin Not Present- Change in Wart/Mole, Dryness, Hives, Jaundice, New Lesions, Non-Healing Wounds, Rash and Ulcer. HEENT Not Present- Earache, Hearing Loss, Hoarseness, Nose Bleed, Oral Ulcers, Ringing in the Ears, Seasonal Allergies, Sinus Pain, Sore Throat, Visual Disturbances, Wears glasses/contact lenses and Yellow Eyes. Respiratory Not Present- Bloody sputum, Chronic Cough, Difficulty Breathing, Snoring and Wheezing. Breast Not Present- Breast Mass, Breast Pain, Nipple Discharge and Skin Changes. Cardiovascular Not Present- Chest Pain, Difficulty Breathing Lying Down, Leg Cramps, Palpitations, Rapid Heart Rate, Shortness of Breath and Swelling of Extremities. Gastrointestinal Present- Bloating, Excessive gas, Hemorrhoids and Rectal Pain. Not Present- Abdominal Pain, Bloody Stool, Change in Bowel Habits, Chronic diarrhea,  Constipation, Difficulty Swallowing, Gets full quickly at meals, Indigestion, Nausea and Vomiting. Female Genitourinary Present- Nocturia. Not Present- Frequency, Painful Urination, Pelvic Pain and Urgency. Musculoskeletal Not Present- Back Pain, Joint Pain, Joint Stiffness, Muscle Pain, Muscle Weakness and Swelling of   Extremities. Neurological Present- Headaches, Numbness and Tremor. Not Present- Decreased Memory, Fainting, Seizures, Tingling, Trouble walking and Weakness. Psychiatric Present- Anxiety and Depression. Not Present- Bipolar, Change in Sleep Pattern, Fearful and Frequent crying. Endocrine Present- Hair Changes. Not Present- Cold Intolerance, Excessive Hunger, Heat Intolerance, Hot flashes and New Diabetes. Hematology Present- Easy Bruising. Not Present- Excessive bleeding, Gland problems, HIV and Persistent Infections.  Vitals (Ashley Beck CMA; 12/30/2014 8:44 AM) 12/30/2014 8:43 AM Weight: 141 lb Height: 63in Body Surface Area: 1.67 m Body Mass Index: 24.98 kg/m  Temp.: 97.2F(Temporal)  Pulse: 80 (Regular)  BP: 126/76 (Sitting, Left Arm, Standard)       Physical Exam (Lakendria Nicastro C. Fitz Matsuo MD; 12/30/2014 9:11 AM) General Mental Status-Alert. General Appearance-Not in acute distress, Not Sickly. Orientation-Oriented X3. Hydration-Well hydrated. Voice-Normal.  Integumentary Global Assessment Upon inspection and palpation of skin surfaces of the - Axillae: non-tender, no inflammation or ulceration, no drainage. and Distribution of scalp and body hair is normal. General Characteristics Temperature - normal warmth is noted.  Head and Neck Head-normocephalic, atraumatic with no lesions or palpable masses. Face Global Assessment - atraumatic, no absence of expression. Neck Global Assessment - no abnormal movements, no bruit auscultated on the right, no bruit auscultated on the left, no decreased range of motion,  non-tender. Trachea-midline. Thyroid Gland Characteristics - non-tender.  Eye Eyeball - Left-Extraocular movements intact, No Nystagmus. Eyeball - Right-Extraocular movements intact, No Nystagmus. Cornea - Left-No Hazy. Cornea - Right-No Hazy. Sclera/Conjunctiva - Left-No scleral icterus, No Discharge. Sclera/Conjunctiva - Right-No scleral icterus, No Discharge. Pupil - Left-Direct reaction to light normal. Pupil - Right-Direct reaction to light normal.  ENMT Ears Pinna - Left - no drainage observed, no generalized tenderness observed. Right - no drainage observed, no generalized tenderness observed. Nose and Sinuses External Inspection of the Nose - no destructive lesion observed. Inspection of the nares - Left - quiet respiration. Right - quiet respiration. Mouth and Throat Lips - Upper Lip - no fissures observed, no pallor noted. Lower Lip - no fissures observed, no pallor noted. Nasopharynx - no discharge present. Oral Cavity/Oropharynx - Tongue - no dryness observed. Oral Mucosa - no cyanosis observed. Hypopharynx - no evidence of airway distress observed.  Chest and Lung Exam Inspection Movements - Normal and Symmetrical. Accessory muscles - No use of accessory muscles in breathing. Palpation Palpation of the chest reveals - Non-tender. Auscultation Breath sounds - Normal and Clear.  Cardiovascular Auscultation Rhythm - Regular. Murmurs & Other Heart Sounds - Auscultation of the heart reveals - No Murmurs and No Systolic Clicks.  Abdomen Inspection Inspection of the abdomen reveals - No Visible peristalsis and No Abnormal pulsations. Umbilicus - No Bleeding, No Urine drainage. Palpation/Percussion Palpation and Percussion of the abdomen reveal - Soft, Non Tender, No Rebound tenderness, No Rigidity (guarding) and No Cutaneous hyperesthesia. Note: Soft and flat. No diastases. Supraumbilical piercing. No umbilical hernia. No pain. No  tenderness.   Female Genitourinary Sexual Maturity Tanner 5 - Adult hair pattern. Note: No inguinal hernias. Normal external genitalia. No vaginal bleeding nor discharge   Rectal Note: Perianal skin clean. RIGHT posterior smaller than RIGHT anterior external hemorrhoid. Normal sphincter tone. No fissure. No fistula. No pruritus. No pilonidal disease.  Anoscopy reveals large RIGHT anterior greater than RIGHT posterior internal hemorrhoids. Grade 2- 3. LEFT lateral grade 2.   Peripheral Vascular Upper Extremity Inspection - Left - No Cyanotic nailbeds, Not Ischemic. Right - No Cyanotic nailbeds, Not Ischemic.  Neurologic Neurologic evaluation reveals -normal attention span and   ability to concentrate, able to name objects and repeat phrases. Appropriate fund of knowledge , normal sensation and normal coordination. Mental Status Affect - not angry, not paranoid. Cranial Nerves-Normal Bilaterally. Gait-Normal.  Neuropsychiatric Mental status exam performed with findings of-able to articulate well with normal speech/language, rate, volume and coherence, thought content normal with ability to perform basic computations and apply abstract reasoning and no evidence of hallucinations, delusions, obsessions or homicidal/suicidal ideation.  Musculoskeletal Global Assessment Spine, Ribs and Pelvis - no instability, subluxation or laxity. Right Upper Extremity - no instability, subluxation or laxity.  Lymphatic Head & Neck  General Head & Neck Lymphatics: Bilateral - Description - No Localized lymphadenopathy. Axillary  General Axillary Region: Bilateral - Description - No Localized lymphadenopathy. Femoral & Inguinal  Generalized Femoral & Inguinal Lymphatics: Left - Description - No Localized lymphadenopathy. Right - Description - No Localized lymphadenopathy.   Results (Ryelan Kazee C. Christianjames Soule MD; 12/30/2014 9:18 AM) Procedures  Name Value Date ANOSCOPY, DIAGNOSTIC (46600) [  Hemorrhoids ] Procedure Anal exam: External Hemorrhoid Internal exam: Internal Hemorrhoids (bleeding) Internal Hemorroids ( non-bleeding) prolapse Other: Please see section of rectal physical exam. Grade 2 and 3 internal hemorrhoids with external hemorrhoids.  Performed: 12/30/2014 9:17 AM    Assessment & Plan  PROLAPSED INTERNAL HEMORRHOIDS, GRADE 3 (K64.2) Impression: Moderately significant internal hemorrhoids with persistent issues of bleeding pain and discomfort. Also with external hemorrhoids causing itching and discomfort.  I offerred the patient several options. At the very least, she needs to stay compliant on a fiber supplement every day the rest of her life.  Because her defecation is in the normal range, reasonable to be more aggressive in treating the hemorrhoids. I think the internal hemorrhoids could benefit from a few banding's although the RIGHT anterior intermittently prolapses at least and serial bandings may not work.  Her biggest concern is the intermittently prolapsing hemorrhoid as well as external hemorrhoids. She would rather have everything dealt with at once. I would involve surgery. Reasonable to do hemorrhoidal internal ligation with pexy and excision of remaining external disease. Did caution her that she'll have more of recovery time but hopefully this gives the best way to correct both the internal & external anatomy. She's been trying to deal with this for 20 years and wishes to be more aggressive.   Current Plans You are being scheduled for surgery - Our schedulers will call you.  You should hear from our office's scheduling department within 5 working days about the location, date, and time of surgery. We try to make accommodations for patient's preferences in scheduling surgery, but sometimes the OR schedule or the surgeon's schedule prevents us from making those accommodations.  If you have not heard from our office ([phone]) in 5  working days, call the office and ask for your surgeon's nurse.  If you have other questions about your diagnosis, plan, or surgery, call the office and ask for your surgeon's nurse.  The anatomy & physiology of the anorectal region was discussed. The pathophysiology of hemorrhoids and differential diagnosis was discussed. Natural history risks without surgery was discussed. I stressed the importance of a bowel regimen to have daily soft bowel movements to minimize progression of disease. Interventions such as sclerotherapy & banding were discussed.  The patient's symptoms are not adequately controlled by medicines and other non-operative treatments. I feel the risks & problems of no surgery outweigh the operative risks; therefore, I recommended surgery to treat the hemorrhoids by ligation, pexy, and possible resection.  Risks   such as bleeding, infection, urinary difficulties, need for further treatment, heart attack, death, and other risks were discussed. I noted a good likelihood this will help address the problem. Goals of post-operative recovery were discussed as well. Possibility that this will not correct all symptoms was explained. Post-operative pain, bleeding, constipation, and other problems after surgery were discussed. We will work to minimize complications. Educational handouts further explaining the pathology, treatment options, and bowel regimen were given as well. Questions were answered. The patient expresses understanding & wishes to proceed with surgery.  ANOSCOPY, DIAGNOSTIC (46600) Pt Education - [facility] Hemorrhoids (Lovelyn Sheeran): discussed with patient and provided information. Pt Education - Pamphlet Given - The Hemorrhoid Book: discussed with patient and provided information. Pt Education - [facility] Rectal Prep for Anorectal outpatient/office surgery: discussed with patient and provided information. PROLAPSED INTERNAL HEMORRHOIDS, GRADE 2 (K64.1) EXTERNAL HEMORRHOIDS WITH COMPLICATION  (K64.4)   Tyshauna Finkbiner C. Jadarrius Maselli, M.D., F.A.C.S. Gastrointestinal and Minimally Invasive Surgery [facility] [address], [address] [place], [place] [address] [phone] Main / Paging     

## 2015-03-23 NOTE — Progress Notes (Signed)
Ok by Dr.Gross for patient to leave after 50 ml void

## 2015-03-23 NOTE — Op Note (Signed)
03/23/2015  10:37 AM  PATIENT:  [name]  40 y.o. female  Patient Care Team: Elizabeth Palau, FNP as PCP - General (Nurse Practitioner) Karie Soda, MD as Consulting Physician (General Surgery) Marlow Baars, MD as Consulting Physician (Obstetrics)  PRE-OPERATIVE DIAGNOSIS:    Grade 2/3 internal hemorrhoids with pain and bleeding External hemorrhoids with itching, pain, and bleeding  POST-OPERATIVE DIAGNOSIS:    Grade 2/3 internal hemorrhoids with pain and bleeding External hemorrhoids with itching, pain, and bleeding  PROCEDURE:   EXAM UNDER ANESTHESIA  LIGATION AND PEXY INTERNAL HEMORRHOIDS  EXCISION SYMPTOMATIC EXTERNAL HEMORRHOIDS  SURGEON:  Surgeon(s): Karie Soda, MD  ASSISTANT: none   ANESTHESIA:   Local field block Anorectal block General  0.25% bupivacaine with epinephrine at the beginning of the case.  Liposomal bupivacaine (Experel) at the end of the case.  EBL:  Total I/O In: 200 [I.V.:200] Out: -   Delay start of Pharmacological VTE agent (>24hrs) due to surgical blood loss or risk of bleeding:  no  DRAINS: none   SPECIMEN:  Source of Specimen:  External hemorrhoids x 2 (left lateral & right lateral)  DISPOSITION OF SPECIMEN:  PATHOLOGY  COUNTS:  YES  PLAN OF CARE: Discharge to home after PACU  PATIENT DISPOSITION:  PACU - hemodynamically stable.  INDICATION: Pleasant woman sent by her gynecologist for concern of persistent hemorrhoids.  Has intermittent episodes of flare bleeding and prolapse.  Exhausted nonoperative management.  Main issues external piles.  I offered surgical ligation and possible hemorrhoidectomy.  The anatomy & physiology of the anorectal region was discussed.  The pathophysiology of hemorrhoids and differential diagnosis was discussed.  Natural history risks without surgery was discussed.   I stressed the importance of a bowel regimen to have daily soft bowel movements to minimize progression of disease.   Interventions such as sclerotherapy & banding were discussed.  The patient's symptoms are not adequately controlled by medicines and other non-operative treatments.  I feel the risks & problems of no surgery outweigh the operative risks; therefore, I recommended surgery to treat the hemorrhoids by ligation, pexy, and possible resection.  Risks such as bleeding, infection, urinary difficulties, need for further treatment, heart attack, death, and other risks were discussed.   I noted a good likelihood this will help address the problem.  Goals of post-operative recovery were discussed as well.  Possibility that this will not correct all symptoms was explained.  Post-operative pain, bleeding, constipation, and other problems after surgery were discussed.  We will work to minimize complications.   Educational handouts further explaining the pathology, treatment options, and bowel regimen were given as well.  Questions were answered.  The patient expresses understanding & wishes to proceed with surgery.   OR FINDINGS: Right posterior greater than right anterior and left lateral grade 3/2 internal hemorrhoids.  External hemorrhoids left lateral & right lateral aspects.  No evidence of fissure, fistula, condyloma, tumor.  Normal sphincter tone.  DESCRIPTION:   Informed consent was confirmed. Patient underwent general anesthesia without difficulty. Patient was placed into prone positioning.  The perianal region was prepped and draped in sterile fashion. Surgical time-out confirmed our plan.  I did digital rectal examination and then transitioned over to anoscopy to get a sense of the anatomy. I proceeded to ligate the hemorrhoidal arteries. I used a 2-0 Vicryl suture on a UR-6 needle in a figure-of-eight fashion over the signal around 6 cm proximal to the anal verge. I then ran that stitch longitudinally more distally to  the white line of [place]. I then tied that stitch down to cause a hemorrhoidopexy. I did  that in at hexagonal pattern (right anterior/lateral/posterior, left anterior/lateral/posterior) for all 6 locations .    At completion of this, most of the hemorrhoids were reduced into the rectum. There was no more prolapse. External anatomy did still left have lateral and right lateral external hemorrhoids.  I excised those longitudinally in a biconcave fusiform fashion.  I closed those wounds vertically using running 3-0 chromic suture, leaving the distal 5 mm open for drainage.  I repeated anoscopy and examination.  There is no stricturing.  There is no bleeding.  There is no prolapse.  Hemostasis was good.  Patient is being extubated go to recovery room.  I had discussed postop care in detail with the patient in the preop holding area.  Instructions are written.  I am about to discuss the patient's status to husband as well.     Ardeth Sportsman, M.D., F.A.C.S. Gastrointestinal and Minimally Invasive Surgery [facility] [address], [address] [place], [place] [address] [phone] Main / Paging

## 2015-03-23 NOTE — Anesthesia Procedure Notes (Signed)
Procedure Name: Intubation Date/Time: 03/23/2015 9:28 AM Performed by: Mechele Claude Pre-anesthesia Checklist: Patient identified, Emergency Drugs available, Suction available and Patient being monitored Patient Re-evaluated:Patient Re-evaluated prior to inductionOxygen Delivery Method: Circle System Utilized Preoxygenation: Pre-oxygenation with 100% oxygen Intubation Type: IV induction Ventilation: Mask ventilation without difficulty Laryngoscope Size: Mac and 3 Grade View: Grade I Tube type: Oral Tube size: 7.0 mm Number of attempts: 1 Airway Equipment and Method: Stylet and LTA kit utilized Placement Confirmation: ETT inserted through vocal cords under direct vision,  positive ETCO2 and breath sounds checked- equal and bilateral Secured at: 21 cm Tube secured with: Tape Dental Injury: Teeth and Oropharynx as per pre-operative assessment

## 2015-03-23 NOTE — Interval H&P Note (Signed)
History and Physical Interval Note:  03/23/2015 9:00 AM  [name]  has presented today for surgery, with the diagnosis of grade 2/3 internal hemorrhoids with pain and bleedingExternal hemorrhoids with itching pain and bleeding  The various methods of treatment have been discussed with the patient and family. After consideration of risks, benefits and other options for treatment, the patient has consented to  Procedure(s): EXAM UNDER ANESTHESIA LIGATION AND PEXY INTERNAL HEMORRHOIDS EXCISION SYMPTOMATIC EXTERNAL HEMORRHOIDS (N/A) as a surgical intervention .  The patient's history has been reviewed, patient examined, no change in status, stable for surgery.  I have reviewed the patient's chart and labs.  Questions were answered to the patient's satisfaction.     Dontai Pember C.

## 2015-03-24 ENCOUNTER — Encounter (HOSPITAL_BASED_OUTPATIENT_CLINIC_OR_DEPARTMENT_OTHER): Payer: Self-pay | Admitting: Surgery

## 2015-03-24 NOTE — Anesthesia Postprocedure Evaluation (Signed)
Anesthesia Post Note  Patient: [name]  Procedure(s) Performed: Procedure(s) (LRB): EXAM UNDER ANESTHESIA LIGATION AND PEXY INTERNAL HEMORRHOIDS EXCISION SYMPTOMATIC EXTERNAL HEMORRHOIDS (N/A)  Patient location during evaluation: PACU Anesthesia Type: General Level of consciousness: awake and alert Pain management: pain level controlled Vital Signs Assessment: post-procedure vital signs reviewed and stable Respiratory status: spontaneous breathing, nonlabored ventilation, respiratory function stable and patient connected to nasal cannula oxygen Cardiovascular status: blood pressure returned to baseline and stable Postop Assessment: no signs of nausea or vomiting Anesthetic complications: no    Last Vitals:  Filed Vitals:   03/23/15 1115 03/23/15 1301  BP: 116/75 118/63  Pulse: 104 90  Temp:  36.6 C  Resp: 17 16    Last Pain:  Filed Vitals:   03/23/15 1325  PainSc: 2                  Teodoro Jeffreys S

## 2015-03-31 ENCOUNTER — Telehealth: Payer: Self-pay | Admitting: General Surgery

## 2015-03-31 NOTE — Telephone Encounter (Signed)
She called today complaining of blood per rectum that would not stop. I recommended that she apply direct pressure to the bleeding for 20 minutes. If that does not stop the bleeding, she will call back or proceed to the emergency department if the bleeding becomes worse.

## 2015-04-30 ENCOUNTER — Encounter: Payer: Self-pay | Admitting: Neurology

## 2015-05-01 ENCOUNTER — Encounter: Payer: Self-pay | Admitting: Neurology

## 2015-06-14 ENCOUNTER — Ambulatory Visit: Payer: 59 | Admitting: Neurology

## 2015-07-18 ENCOUNTER — Encounter: Payer: Self-pay | Admitting: Neurology

## 2015-07-18 ENCOUNTER — Ambulatory Visit (INDEPENDENT_AMBULATORY_CARE_PROVIDER_SITE_OTHER): Payer: Medicaid Other | Admitting: Neurology

## 2015-07-18 VITALS — BP 126/82 | HR 84 | Resp 14 | Ht 63.0 in | Wt 146.6 lb

## 2015-07-18 DIAGNOSIS — M546 Pain in thoracic spine: Secondary | ICD-10-CM

## 2015-07-18 DIAGNOSIS — G43009 Migraine without aura, not intractable, without status migrainosus: Secondary | ICD-10-CM

## 2015-07-18 DIAGNOSIS — F411 Generalized anxiety disorder: Secondary | ICD-10-CM

## 2015-07-18 MED ORDER — INDOMETHACIN 25 MG PO CAPS: ORAL_CAPSULE | ORAL | Status: DC
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
## 2015-07-20 ENCOUNTER — Encounter: Payer: Self-pay | Admitting: Neurology

## 2015-08-07 ENCOUNTER — Other Ambulatory Visit: Payer: Self-pay | Admitting: Neurology

## 2015-08-08 ENCOUNTER — Ambulatory Visit: Payer: 59 | Admitting: Neurology

## 2015-08-28 ENCOUNTER — Other Ambulatory Visit: Payer: Self-pay | Admitting: Obstetrics and Gynecology

## 2015-09-10 ENCOUNTER — Other Ambulatory Visit: Payer: Self-pay | Admitting: Neurology

## 2015-10-10 ENCOUNTER — Encounter (HOSPITAL_COMMUNITY): Payer: Self-pay | Admitting: Emergency Medicine

## 2015-10-10 ENCOUNTER — Emergency Department (HOSPITAL_COMMUNITY): Payer: Medicaid Other | Admitting: Certified Registered Nurse Anesthetist

## 2015-10-10 ENCOUNTER — Emergency Department (HOSPITAL_COMMUNITY): Payer: Medicaid Other

## 2015-10-10 ENCOUNTER — Encounter (HOSPITAL_COMMUNITY)
Admission: EM | Disposition: A | Discharge status: Home / Self Care | Payer: Self-pay | Source: Home / Self Care | Attending: Emergency Medicine

## 2015-10-10 ENCOUNTER — Observation Stay (HOSPITAL_COMMUNITY)
Admission: EM | Admit: 2015-10-10 | Discharge: 2015-10-11 | Disposition: A | Discharge status: Home / Self Care | Payer: Medicaid Other | Attending: Surgery | Admitting: Surgery

## 2015-10-10 DIAGNOSIS — K37 Unspecified appendicitis: Secondary | ICD-10-CM | POA: Diagnosis present

## 2015-10-10 DIAGNOSIS — G43909 Migraine, unspecified, not intractable, without status migrainosus: Secondary | ICD-10-CM | POA: Diagnosis not present

## 2015-10-10 DIAGNOSIS — K219 Gastro-esophageal reflux disease without esophagitis: Secondary | ICD-10-CM | POA: Insufficient documentation

## 2015-10-10 DIAGNOSIS — F329 Major depressive disorder, single episode, unspecified: Secondary | ICD-10-CM | POA: Diagnosis not present

## 2015-10-10 DIAGNOSIS — K3580 Unspecified acute appendicitis: Secondary | ICD-10-CM | POA: Diagnosis not present

## 2015-10-10 DIAGNOSIS — Z87891 Personal history of nicotine dependence: Secondary | ICD-10-CM | POA: Diagnosis not present

## 2015-10-10 DIAGNOSIS — L409 Psoriasis, unspecified: Secondary | ICD-10-CM | POA: Insufficient documentation

## 2015-10-10 DIAGNOSIS — Z79899 Other long term (current) drug therapy: Secondary | ICD-10-CM | POA: Insufficient documentation

## 2015-10-10 DIAGNOSIS — K381 Appendicular concretions: Secondary | ICD-10-CM | POA: Diagnosis not present

## 2015-10-10 DIAGNOSIS — F411 Generalized anxiety disorder: Secondary | ICD-10-CM | POA: Insufficient documentation

## 2015-10-10 DIAGNOSIS — Z9049 Acquired absence of other specified parts of digestive tract: Secondary | ICD-10-CM

## 2015-10-10 DIAGNOSIS — K353 Acute appendicitis with localized peritonitis, without perforation or gangrene: Secondary | ICD-10-CM

## 2015-10-10 HISTORY — PX: LAPAROSCOPIC APPENDECTOMY: SHX408

## 2015-10-10 LAB — CBC WITH DIFFERENTIAL/PLATELET
BASOS ABS: 0 10*3/uL (ref 0.0–0.1)
BASOS PCT: 0 %
EOS ABS: 0 10*3/uL (ref 0.0–0.7)
Eosinophils Relative: 0 %
HCT: 37 % (ref 36.0–46.0)
HEMOGLOBIN: 12.5 g/dL (ref 12.0–15.0)
Lymphocytes Relative: 5 %
Lymphs Abs: 0.6 10*3/uL — ABNORMAL LOW (ref 0.7–4.0)
MCH: 30.5 pg (ref 26.0–34.0)
MCHC: 33.8 g/dL (ref 30.0–36.0)
MCV: 90.2 fL (ref 78.0–100.0)
MONO ABS: 0.3 10*3/uL (ref 0.1–1.0)
MONOS PCT: 2 %
NEUTROS PCT: 93 %
Neutro Abs: 10.9 10*3/uL — ABNORMAL HIGH (ref 1.7–7.7)
Platelets: 259 10*3/uL (ref 150–400)
RBC: 4.1 MIL/uL (ref 3.87–5.11)
RDW: 12.8 % (ref 11.5–15.5)
WBC: 11.8 10*3/uL — ABNORMAL HIGH (ref 4.0–10.5)

## 2015-10-10 LAB — URINALYSIS, ROUTINE W REFLEX MICROSCOPIC
Bilirubin Urine: NEGATIVE
Glucose, UA: NEGATIVE mg/dL
Hgb urine dipstick: NEGATIVE
Ketones, ur: 15 mg/dL — AB
LEUKOCYTES UA: NEGATIVE
NITRITE: NEGATIVE
PH: 5 (ref 5.0–8.0)
Protein, ur: NEGATIVE mg/dL
SPECIFIC GRAVITY, URINE: 1.03 (ref 1.005–1.030)

## 2015-10-10 LAB — COMPREHENSIVE METABOLIC PANEL
ALBUMIN: 4.6 g/dL (ref 3.5–5.0)
ALT: 22 U/L (ref 14–54)
ANION GAP: 8 (ref 5–15)
AST: 29 U/L (ref 15–41)
Alkaline Phosphatase: 80 U/L (ref 38–126)
BUN: 14 mg/dL (ref 6–20)
CO2: 26 mmol/L (ref 22–32)
Calcium: 9.2 mg/dL (ref 8.9–10.3)
Chloride: 102 mmol/L (ref 101–111)
Creatinine, Ser: 0.76 mg/dL (ref 0.44–1.00)
GFR calc Af Amer: >60 mL/min (ref >60)
GFR calc non Af Amer: >60 mL/min (ref >60)
GLUCOSE: 145 mg/dL — AB (ref 65–99)
POTASSIUM: 3.7 mmol/L (ref 3.5–5.1)
SODIUM: 136 mmol/L (ref 135–145)
Total Bilirubin: 0.3 mg/dL (ref 0.3–1.2)
Total Protein: 7.7 g/dL (ref 6.5–8.1)

## 2015-10-10 LAB — POC URINE PREG, ED: Preg Test, Ur: NEGATIVE

## 2015-10-10 LAB — LIPASE, BLOOD: Lipase: 33 U/L (ref 11–51)

## 2015-10-10 SURGERY — APPENDECTOMY, LAPAROSCOPIC
Anesthesia: General | Site: Abdomen

## 2015-10-10 MED ORDER — ROCURONIUM BROMIDE 100 MG/10ML IV SOLN
INTRAVENOUS | Status: DC | PRN
  Administered 2015-10-10: 30 mg via INTRAVENOUS

## 2015-10-10 MED ORDER — PROPOFOL 10 MG/ML IV BOLUS
INTRAVENOUS | Status: AC
  Filled 2015-10-10: qty 20

## 2015-10-10 MED ORDER — ONDANSETRON HCL 4 MG/2ML IJ SOLN
INTRAMUSCULAR | Status: DC | PRN
  Administered 2015-10-10: 4 mg via INTRAVENOUS

## 2015-10-10 MED ORDER — DEXAMETHASONE SODIUM PHOSPHATE 10 MG/ML IJ SOLN
INTRAMUSCULAR | Status: AC
  Filled 2015-10-10: qty 1

## 2015-10-10 MED ORDER — 0.9 % SODIUM CHLORIDE (POUR BTL) OPTIME
TOPICAL | Status: DC | PRN
  Administered 2015-10-10: 1000 mL

## 2015-10-10 MED ORDER — SUCCINYLCHOLINE CHLORIDE 20 MG/ML IJ SOLN
INTRAMUSCULAR | Status: DC | PRN
  Administered 2015-10-10: 100 mg via INTRAVENOUS

## 2015-10-10 MED ORDER — EPHEDRINE SULFATE 50 MG/ML IJ SOLN
INTRAMUSCULAR | Status: AC
  Filled 2015-10-10: qty 1

## 2015-10-10 MED ORDER — LIDOCAINE HCL (CARDIAC) 20 MG/ML IV SOLN
INTRAVENOUS | Status: AC
  Filled 2015-10-10: qty 5

## 2015-10-10 MED ORDER — DIPHENHYDRAMINE HCL 50 MG/ML IJ SOLN: 25.0000 mg | Freq: Four times a day (QID) | INTRAMUSCULAR | Status: DC | PRN

## 2015-10-10 MED ORDER — BUPIVACAINE LIPOSOME 1.3 % IJ SUSP
INTRAMUSCULAR | Status: DC | PRN
  Administered 2015-10-10: 20 mL

## 2015-10-10 MED ORDER — PIPERACILLIN-TAZOBACTAM 3.375 G IVPB: 3.3750 g | Freq: Three times a day (TID) | INTRAVENOUS | Status: DC

## 2015-10-10 MED ORDER — BUPIVACAINE LIPOSOME 1.3 % IJ SUSP
20.0000 mL | Freq: Once | INTRAMUSCULAR | Status: DC
  Filled 2015-10-10: qty 20

## 2015-10-10 MED ORDER — MEPERIDINE HCL 50 MG/ML IJ SOLN: 6.2500 mg | INTRAMUSCULAR | Status: DC | PRN

## 2015-10-10 MED ORDER — LIDOCAINE HCL (CARDIAC) 20 MG/ML IV SOLN
INTRAVENOUS | Status: DC | PRN
  Administered 2015-10-10: 50 mg via INTRAVENOUS

## 2015-10-10 MED ORDER — ALBUTEROL SULFATE HFA 108 (90 BASE) MCG/ACT IN AERS
INHALATION_SPRAY | RESPIRATORY_TRACT | Status: DC | PRN
  Administered 2015-10-10 (×2): 2 via RESPIRATORY_TRACT

## 2015-10-10 MED ORDER — ONDANSETRON HCL 4 MG/2ML IJ SOLN
4.0000 mg | Freq: Once | INTRAMUSCULAR | Status: AC
  Administered 2015-10-10: 4 mg via INTRAVENOUS
  Filled 2015-10-10: qty 2

## 2015-10-10 MED ORDER — FENTANYL CITRATE (PF) 100 MCG/2ML IJ SOLN
INTRAMUSCULAR | Status: DC | PRN
  Administered 2015-10-10: 100 ug via INTRAVENOUS
  Administered 2015-10-10 (×2): 50 ug via INTRAVENOUS

## 2015-10-10 MED ORDER — LACTATED RINGERS IV SOLN
INTRAVENOUS | Status: DC | PRN
  Administered 2015-10-10 (×2): via INTRAVENOUS

## 2015-10-10 MED ORDER — SUMATRIPTAN SUCCINATE 25 MG PO TABS
25.0000 mg | ORAL_TABLET | ORAL | Status: DC | PRN
  Filled 2015-10-10: qty 1

## 2015-10-10 MED ORDER — SUGAMMADEX SODIUM 200 MG/2ML IV SOLN
INTRAVENOUS | Status: AC
  Filled 2015-10-10: qty 2

## 2015-10-10 MED ORDER — PIPERACILLIN-TAZOBACTAM 3.375 G IVPB 30 MIN
3.3750 g | INTRAVENOUS | Status: AC
  Administered 2015-10-10: 3.375 g via INTRAVENOUS
  Filled 2015-10-10: qty 50

## 2015-10-10 MED ORDER — SODIUM CHLORIDE 0.9 % IV BOLUS (SEPSIS)
1000.0000 mL | Freq: Once | INTRAVENOUS | Status: AC
  Administered 2015-10-10: 1000 mL via INTRAVENOUS

## 2015-10-10 MED ORDER — HYDROCODONE-ACETAMINOPHEN 5-325 MG PO TABS
1.0000 | ORAL_TABLET | ORAL | Status: DC | PRN
  Administered 2015-10-10 – 2015-10-11 (×4): 2 via ORAL
  Filled 2015-10-10 (×4): qty 2

## 2015-10-10 MED ORDER — IOPAMIDOL (ISOVUE-300) INJECTION 61%
100.0000 mL | Freq: Once | INTRAVENOUS | Status: AC | PRN
  Administered 2015-10-10: 100 mL via INTRAVENOUS

## 2015-10-10 MED ORDER — DIPHENHYDRAMINE HCL 25 MG PO CAPS: 25.0000 mg | ORAL_CAPSULE | Freq: Four times a day (QID) | ORAL | Status: DC | PRN

## 2015-10-10 MED ORDER — MIDAZOLAM HCL 2 MG/2ML IJ SOLN
INTRAMUSCULAR | Status: AC
  Filled 2015-10-10: qty 2

## 2015-10-10 MED ORDER — FENTANYL CITRATE (PF) 100 MCG/2ML IJ SOLN
INTRAMUSCULAR | Status: AC
  Filled 2015-10-10: qty 2

## 2015-10-10 MED ORDER — DEXTROSE 5 % IV SOLN
2.0000 g | Freq: Two times a day (BID) | INTRAVENOUS | Status: AC
  Administered 2015-10-11: 2 g via INTRAVENOUS
  Filled 2015-10-10: qty 2

## 2015-10-10 MED ORDER — HYDROMORPHONE HCL 1 MG/ML IJ SOLN: 0.5000 mg | INTRAMUSCULAR | Status: DC | PRN

## 2015-10-10 MED ORDER — DEXAMETHASONE SODIUM PHOSPHATE 10 MG/ML IJ SOLN
INTRAMUSCULAR | Status: DC | PRN
  Administered 2015-10-10: 10 mg via INTRAVENOUS

## 2015-10-10 MED ORDER — PROPOFOL 10 MG/ML IV BOLUS
INTRAVENOUS | Status: DC | PRN
  Administered 2015-10-10: 40 mg via INTRAVENOUS
  Administered 2015-10-10: 200 mg via INTRAVENOUS

## 2015-10-10 MED ORDER — HYDROMORPHONE HCL 1 MG/ML IJ SOLN: 0.2500 mg | INTRAMUSCULAR | Status: DC | PRN

## 2015-10-10 MED ORDER — ONDANSETRON HCL 4 MG/2ML IJ SOLN: 4.0000 mg | Freq: Four times a day (QID) | INTRAMUSCULAR | Status: DC | PRN

## 2015-10-10 MED ORDER — TIZANIDINE HCL 2 MG PO TABS
2.0000 mg | ORAL_TABLET | Freq: Every evening | ORAL | Status: DC | PRN
  Filled 2015-10-10: qty 2

## 2015-10-10 MED ORDER — ONDANSETRON 4 MG PO TBDP: 4.0000 mg | ORAL_TABLET | Freq: Four times a day (QID) | ORAL | Status: DC | PRN

## 2015-10-10 MED ORDER — ESCITALOPRAM OXALATE 20 MG PO TABS
10.0000 mg | ORAL_TABLET | Freq: Every day | ORAL | Status: DC
  Administered 2015-10-10: 10 mg via ORAL
  Filled 2015-10-10: qty 1

## 2015-10-10 MED ORDER — ONDANSETRON HCL 4 MG/2ML IJ SOLN
INTRAMUSCULAR | Status: AC
  Filled 2015-10-10: qty 2

## 2015-10-10 MED ORDER — MORPHINE SULFATE (PF) 4 MG/ML IV SOLN
4.0000 mg | Freq: Once | INTRAVENOUS | Status: AC
  Administered 2015-10-10: 4 mg via INTRAVENOUS
  Filled 2015-10-10: qty 1

## 2015-10-10 MED ORDER — MORPHINE SULFATE (PF) 2 MG/ML IV SOLN: 1.0000 mg | INTRAVENOUS | Status: DC | PRN

## 2015-10-10 MED ORDER — PROMETHAZINE HCL 25 MG/ML IJ SOLN
6.2500 mg | INTRAMUSCULAR | Status: DC | PRN
Start: 2015-10-10 — End: 2015-10-10

## 2015-10-10 MED ORDER — PIPERACILLIN-TAZOBACTAM 3.375 G IVPB
3.3750 g | Freq: Three times a day (TID) | INTRAVENOUS | Status: DC
  Filled 2015-10-10: qty 50

## 2015-10-10 MED ORDER — LACTATED RINGERS IR SOLN
Status: DC | PRN
  Administered 2015-10-10: 1000 mL

## 2015-10-10 MED ORDER — ROCURONIUM BROMIDE 100 MG/10ML IV SOLN
INTRAVENOUS | Status: AC
  Filled 2015-10-10: qty 1

## 2015-10-10 MED ORDER — MORPHINE SULFATE (PF) 10 MG/ML IV SOLN
10.0000 mg | Freq: Once | INTRAVENOUS | Status: AC
Start: 2015-10-10 — End: 2015-10-10
  Administered 2015-10-10: 10 mg via INTRAVENOUS
  Filled 2015-10-10: qty 1

## 2015-10-10 MED ORDER — HEPARIN SODIUM (PORCINE) 5000 UNIT/ML IJ SOLN
5000.0000 [IU] | Freq: Three times a day (TID) | INTRAMUSCULAR | Status: DC
  Administered 2015-10-10 – 2015-10-11 (×2): 5000 [IU] via SUBCUTANEOUS
  Filled 2015-10-10 (×2): qty 1

## 2015-10-10 MED ORDER — SODIUM CHLORIDE 0.9 % IJ SOLN
INTRAMUSCULAR | Status: AC
  Filled 2015-10-10: qty 10

## 2015-10-10 MED ORDER — SUGAMMADEX SODIUM 200 MG/2ML IV SOLN
INTRAVENOUS | Status: DC | PRN
  Administered 2015-10-10: 200 mg via INTRAVENOUS

## 2015-10-10 MED ORDER — FAMOTIDINE 20 MG PO TABS: 40.0000 mg | ORAL_TABLET | Freq: Every day | ORAL | Status: DC | PRN

## 2015-10-10 MED ORDER — SUMATRIPTAN SUCCINATE 6 MG/0.5ML ~~LOC~~ SOLN
6.0000 mg | Freq: Every day | SUBCUTANEOUS | Status: DC | PRN
  Filled 2015-10-10: qty 0.5

## 2015-10-10 MED ORDER — MIDAZOLAM HCL 5 MG/5ML IJ SOLN
INTRAMUSCULAR | Status: DC | PRN
  Administered 2015-10-10: 2 mg via INTRAVENOUS

## 2015-10-10 MED ORDER — LACTATED RINGERS IV SOLN: INTRAVENOUS | Status: DC

## 2015-10-10 MED ORDER — KCL IN DEXTROSE-NACL 20-5-0.45 MEQ/L-%-% IV SOLN
INTRAVENOUS | Status: DC
  Administered 2015-10-10: 19:00:00 via INTRAVENOUS
  Administered 2015-10-11: 1000 mL via INTRAVENOUS
  Filled 2015-10-10 (×3): qty 1000

## 2015-10-10 SURGICAL SUPPLY — 35 items
APPLIER CLIP ROT 10 11.4 M/L (STAPLE)
CABLE HIGH FREQUENCY MONO STRZ (ELECTRODE) ×3 IMPLANT
CHLORAPREP W/TINT 26ML (MISCELLANEOUS) ×3 IMPLANT
CLIP APPLIE ROT 10 11.4 M/L (STAPLE) IMPLANT
COVER SURGICAL LIGHT HANDLE (MISCELLANEOUS) IMPLANT
CUTTER FLEX LINEAR 45M (STAPLE) ×3 IMPLANT
DECANTER SPIKE VIAL GLASS SM (MISCELLANEOUS) IMPLANT
DRAPE LAPAROSCOPIC ABDOMINAL (DRAPES) IMPLANT
ELECT REM PT RETURN 9FT ADLT (ELECTROSURGICAL) ×3
ELECTRODE REM PT RTRN 9FT ADLT (ELECTROSURGICAL) ×1 IMPLANT
ENDOLOOP SUT PDS II  0 18 (SUTURE)
ENDOLOOP SUT PDS II 0 18 (SUTURE) IMPLANT
GLOVE BIOGEL M 8.0 STRL (GLOVE) ×3 IMPLANT
GOWN STRL REUS W/TWL XL LVL3 (GOWN DISPOSABLE) ×9 IMPLANT
IRRIG SUCT STRYKERFLOW 2 WTIP (MISCELLANEOUS) ×3
IRRIGATION SUCT STRKRFLW 2 WTP (MISCELLANEOUS) ×1 IMPLANT
KIT BASIN OR (CUSTOM PROCEDURE TRAY) ×3 IMPLANT
LIQUID BAND (GAUZE/BANDAGES/DRESSINGS) ×3 IMPLANT
POUCH RETRIEVAL ECOSAC 10 (ENDOMECHANICALS) ×1 IMPLANT
POUCH RETRIEVAL ECOSAC 10MM (ENDOMECHANICALS) ×2
POUCH SPECIMEN RETRIEVAL 10MM (ENDOMECHANICALS) IMPLANT
RELOAD 45 VASCULAR/THIN (ENDOMECHANICALS) ×3 IMPLANT
RELOAD STAPLE TA45 3.5 REG BLU (ENDOMECHANICALS) IMPLANT
SCISSORS LAP 5X45 EPIX DISP (ENDOMECHANICALS) ×3 IMPLANT
SHEARS HARMONIC ACE PLUS 45CM (MISCELLANEOUS) ×3 IMPLANT
SLEEVE XCEL OPT CAN 5 100 (ENDOMECHANICALS) ×3 IMPLANT
STAPLER VISISTAT 35W (STAPLE) IMPLANT
SUT VIC AB 4-0 SH 18 (SUTURE) ×3 IMPLANT
TOWEL OR 17X26 10 PK STRL BLUE (TOWEL DISPOSABLE) ×3 IMPLANT
TRAY FOLEY W/METER SILVER 14FR (SET/KITS/TRAYS/PACK) ×3 IMPLANT
TRAY LAPAROSCOPIC (CUSTOM PROCEDURE TRAY) ×3 IMPLANT
TROCAR BLADELESS OPT 5 100 (ENDOMECHANICALS) ×3 IMPLANT
TROCAR XCEL BLUNT TIP 100MML (ENDOMECHANICALS) ×3 IMPLANT
TROCAR XCEL NON-BLD 11X100MML (ENDOMECHANICALS) IMPLANT
TUBING INSUF HEATED (TUBING) ×3 IMPLANT

## 2015-10-10 NOTE — ED Notes (Signed)
Bed: WA08 Expected date:  Expected time:  Means of arrival:  Comments: EMS- 40yo F, epigastric pain

## 2015-10-10 NOTE — Progress Notes (Signed)
Pharmacy Antibiotic Note  [name] is a 40 y.o. female admitted on 10/10/2015 with suspected intra-abdominal infection.Marland Kitchen.  Pharmacy has been consulted for Zosyn dosing.  Plan: Zosyn 3.375g IV over 30min now then 3.375g IV Q8H infused over 4hrs. Dose-adjustments are not likely to be needed. Pharmacy will sign-off.      Temp (24hrs), Avg:98.5 F (36.9 C), Min:98 F (36.7 C), Max:99 F (37.2 C)   Recent Labs Lab 10/10/15 1015  WBC 11.8*  CREATININE 0.76    CrCl cannot be calculated (Unknown ideal weight.).    Allergies  Allergen Reactions  . Levaquin [Levofloxacin] Other (See Comments)    Neuro symptoms/hallucinations    Antimicrobials this admission: 8/15 Zosyn >>   Dose adjustments this admission:   Microbiology results:   Thank you for allowing pharmacy to be a part of this patient's care.  Charolotte Ekeom [place], PharmD, pager [phone]. 10/10/2015,1:26 PM.
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
## 2015-10-10 NOTE — Anesthesia Postprocedure Evaluation (Signed)
Anesthesia Post Note  Patient: Orvilla FusJennifer J Puopolo  Procedure(s) Performed: Procedure(s) (LRB): APPENDECTOMY LAPAROSCOPIC (N/A)  Patient location during evaluation: PACU Anesthesia Type: General Level of consciousness: awake and alert Pain management: pain level controlled Vital Signs Assessment: post-procedure vital signs reviewed and stable Respiratory status: spontaneous breathing, nonlabored ventilation, respiratory function stable and patient connected to nasal cannula oxygen Cardiovascular status: blood pressure returned to baseline and stable Postop Assessment: no signs of nausea or vomiting Anesthetic complications: no Comments: Laryngospasm after extubation broken with positive pressure and propofol. Currently stable and breathing normally with good O2 sat.     Last Vitals:  Vitals:   10/10/15 1719 10/10/15 1727  BP:  114/74  Pulse: (!) 101   Resp: (!) 21 20  Temp:  36.8 C    Last Pain:  Vitals:   10/10/15 1308  TempSrc: Oral  PainSc:                  Shelton SilvasKevin D Tya Haughey
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
## 2015-10-10 NOTE — ED Notes (Signed)
RN at bedside starting IV 

## 2015-10-10 NOTE — Op Note (Signed)
Surgeon: Wenda LowMatt Deyanira Fesler, MD, FACS  Asst:  none  Anes:  General  Procedure: Laparoscopic appendectomy  Diagnosis: Acute appendicitis with fecalith and swollen distal appendix  Complications: none  EBL:   minimal cc  Drains: none  Description of Procedure:  The [facility].  After anesthesia was administered and the patient was prepped a timeout was performed.  The abdomen was entered with a Hasson technique through the umbilicus.  Two 5 mm trocars were inserted in the LLQ and the RUQ.  The appendix was swollen and strutted and the base was non dilated.  The mesentery of the appendix was divided with the Harmonic scalpel.  The base of the appendix was transected with a 4.5 mm Ethicon stapler with a white load.  The appendix was place in a bag and brought out through the umbilicus.  No spillage.  The umbilicus was closed with a figure of 8 of 0 Vicryl.  Exparel was injected into the incisions which were closed with 4-0 vicryl and Liquiban.    The patient tolerated the procedure well and was taken to the PACU in stable condition.     Matt B. Daphine DeutscherMartin, MD, [facility], [place] [phone]

## 2015-10-10 NOTE — ED Notes (Signed)
MD at bedside. 

## 2015-10-10 NOTE — Anesthesia Procedure Notes (Signed)
Procedure Name: Intubation Date/Time: 10/10/2015 3:00 PM Performed by: Destaney Sarkis, [name] Pre-anesthesia Checklist: Patient identified, Emergency Drugs available, Suction available, Patient being monitored and Timeout performed Patient Re-evaluated:Patient Re-evaluated prior to inductionOxygen Delivery Method: Circle system utilized Preoxygenation: Pre-oxygenation with 100% oxygen Intubation Type: IV induction Ventilation: Mask ventilation without difficulty Laryngoscope Size: Mac and 4 Grade View: Grade I Tube type: Oral Tube size: 7.5 mm Number of attempts: 1 Airway Equipment and Method: Stylet Placement Confirmation: ETT inserted through vocal cords under direct vision,  positive ETCO2,  CO2 detector and breath sounds checked- equal and bilateral Secured at: 21 cm Tube secured with: Tape Dental Injury: Teeth and Oropharynx as per pre-operative assessment

## 2015-10-10 NOTE — Transfer of Care (Signed)
Immediate Anesthesia Transfer of Care Note  Patient: [name]  Procedure(s) Performed: Procedure(s): APPENDECTOMY LAPAROSCOPIC (N/A)  Patient Location: PACU  Anesthesia Type:General  Level of Consciousness:  sedated, patient cooperative and responds to stimulation  Airway & Oxygen Therapy:Patient Spontanous Breathing and Patient connected to face mask oxgen  Post-op Assessment:  Report given to PACU RN and Post -op Vital signs reviewed and stable  Post vital signs:  Reviewed and stable  Last Vitals:  Vitals:   10/10/15 1308 10/10/15 1330  BP:  126/76  Pulse:  75  Resp:    Temp: 37.2 C     Complications: No apparent anesthesia complications

## 2015-10-10 NOTE — Anesthesia Preprocedure Evaluation (Addendum)
Anesthesia Evaluation  Patient identified by MRN, date of birth, ID band Patient awake    Reviewed: Allergy & Precautions, NPO status , Patient's Chart, lab work & pertinent test results  Airway Mallampati: I  TM Distance: >3 FB Neck ROM: Full    Dental  (+) Teeth Intact, Dental Advisory Given   Pulmonary former smoker,    breath sounds clear to auscultation       Cardiovascular negative cardio ROS   Rhythm:Regular Rate:Normal     Neuro/Psych  Headaches, PSYCHIATRIC DISORDERS Anxiety Depression    GI/Hepatic Neg liver ROS, GERD  ,  Endo/Other  negative endocrine ROS  Renal/GU negative Renal ROS  negative genitourinary   Musculoskeletal negative musculoskeletal ROS (+)   Abdominal Normal abdominal exam  (+)   Peds negative pediatric ROS (+)  Hematology negative hematology ROS (+)   Anesthesia Other Findings   Reproductive/Obstetrics negative OB ROS                            Lab Results  Component Value Date   WBC 11.8 (H) 10/10/2015   HGB 12.5 10/10/2015   HCT 37.0 10/10/2015   MCV 90.2 10/10/2015   PLT 259 10/10/2015   Lab Results  Component Value Date   CREATININE 0.76 10/10/2015   BUN 14 10/10/2015   NA 136 10/10/2015   K 3.7 10/10/2015   CL 102 10/10/2015   CO2 26 10/10/2015   No results found for: INR, PROTIME  Anesthesia Physical Anesthesia Plan  ASA: II and emergent  Anesthesia Plan: General   Post-op Pain Management:    Induction: Intravenous, Rapid sequence and Cricoid pressure planned  Airway Management Planned: Oral ETT  Additional Equipment:   Intra-op Plan:   Post-operative Plan: Extubation in OR  Informed Consent: I have reviewed the patients History and Physical, chart, labs and discussed the procedure including the risks, benefits and alternatives for the proposed anesthesia with the patient or authorized representative who has indicated his/her  understanding and acceptance.   Dental advisory given  Plan Discussed with: CRNA  Anesthesia Plan Comments:        Anesthesia Quick Evaluation

## 2015-10-10 NOTE — ED Triage Notes (Signed)
Patient from home with upper abdominal pain.  She describes it as a burning pain.  Nausea but denies vomiting or diarrhea.

## 2015-10-11 MED ORDER — HYDROCODONE-ACETAMINOPHEN 5-325 MG PO TABS
1.0000 | ORAL_TABLET | ORAL | 0 refills | Status: DC | PRN
Start: 2015-10-11 — End: 2015-12-31

## 2015-10-11 MED ORDER — APAP 325 MG PO TABS: ORAL_TABLET | ORAL | Status: AC

## 2015-10-11 NOTE — Progress Notes (Signed)
1 Day Post-Op  Subjective: She looks great tolerated breakfast and oral pain medicines. Ready to go home.  Objective: Vital signs in last 24 hours: Temp:  [97.5 F (36.4 C)-99 F (37.2 C)] 98.3 F (36.8 C) (08/16 0951) Pulse Rate:  [75-120] 80 (08/16 0951) Resp:  [12-21] 18 (08/16 0951) BP: (103-130)/(62-84) 123/73 (08/16 0951) SpO2:  [88 %-99 %] 99 % (08/16 0951) Weight:  [72.8 kg (160 lb 7.9 oz)] 72.8 kg (160 lb 7.9 oz) (08/15 1832) Last BM Date: 10/10/15 Afebrile vital signs are stable No labs Intake/Output from previous day: 08/15 0701 - 08/16 0700 In: 3135.4 [I.V.:3135.4] Out: 1405 [Urine:1400; Blood:5] Intake/Output this shift: Total I/O In: 120 [P.O.:120] Out: -   General appearance: alert, cooperative and no distress GI: Soft sore sites look fine.  Lab Results:   Recent Labs  10/10/15 1015  WBC 11.8*  HGB 12.5  HCT 37.0  PLT 259    BMET  Recent Labs  10/10/15 1015  NA 136  K 3.7  CL 102  CO2 26  GLUCOSE 145*  BUN 14  CREATININE 0.76  CALCIUM 9.2   PT/INR No results for input(s): LABPROT, INR in the last 72 hours.   Recent Labs Lab 10/10/15 1015  AST 29  ALT 22  ALKPHOS 80  BILITOT 0.3  PROT 7.7  ALBUMIN 4.6     Lipase     Component Value Date/Time   LIPASE 33 10/10/2015 1015     Studies/Results: Ct Abdomen Pelvis W Contrast  Result Date: 10/10/2015 CLINICAL DATA:  Abdominal pain starting yesterday, no involving the right lower quadrant. Nausea. EXAM: CT ABDOMEN AND PELVIS WITH CONTRAST TECHNIQUE: Multidetector CT imaging of the abdomen and pelvis was performed using the standard protocol following bolus administration of intravenous contrast. CONTRAST:  100mL ISOVUE-300 IOPAMIDOL (ISOVUE-300) INJECTION 61% COMPARISON:  Lumbar MRI from 07/15/2015 FINDINGS: Lower chest:  Unremarkable Hepatobiliary: Mild periportal edema. Gallbladder unremarkable. No biliary dilatation. Pancreas: Unremarkable Spleen: Unremarkable Adrenals/Urinary  Tract: Unremarkable Stomach/Bowel: Appendiceal diameter 1.3 cm, with some internal gas in the appendix and also internal high density structures favoring appendicoliths. Periappendiceal stranding. Parts of the appendix do not have considerable wall thickening but overall appearance compatible with acute appendicitis. No abscess identified. No dilated bowel. Vascular/Lymphatic: Unremarkable Reproductive: Unremarkable Other: Trace free pelvic fluid, possibly physiologic. Musculoskeletal: Unremarkable IMPRESSION: 1. Acute appendicitis, appendiceal diameter 1.3 cm, with mild periappendiceal stranding. 2. Mild periportal edema, nonspecific. 3. Trace free pelvic fluid, possibly physiologic. Electronically Signed   By: Gaylyn RongWalter  Liebkemann M.D.   On: 10/10/2015 12:17    Medications: . escitalopram  10 mg Oral QHS  . heparin subcutaneous  5,000 Units Subcutaneous Q8H    Assessment/Plan Acute appendicitis Status post laparoscopic appendectomy 10/10/15, Dr. Luretha MurphyMatthew Martin FEN: Regular diet ID: Cefotetan 2 g IV and Zosyn 3.75 mg IV 1 yesterday DVT: Heparin/SCD    Plan: Home today follow-up in the clinic.   LOS: 0 days    [name] 10/11/2015 [phone]

## 2015-10-11 NOTE — Progress Notes (Signed)
Discharge instructions discussed with patient, verbalized understanding and agreement, prescription given to family

## 2015-10-11 NOTE — Discharge Instructions (Signed)
Laparoscopic Appendectomy, Adult Appendectomy is surgery to remove the appendix. Laparoscopic surgery uses several small cuts (incisions) instead of one large incision. Laparoscopic surgery offers a shorter recovery time and less discomfort. LET YOUR CAREGIVER KNOW ABOUT:  Allergies to food or medicine.  Medicines taken, including vitamins, dietary supplements, herbs, eyedrops, over-the-counter medicines, and creams.  Use of steroids (by mouth or creams).  Previous problems with anesthetics or numbing medicines.  History of bleeding problems or blood clots.  Previous surgery.  Other health problems, including diabetes, heart problems, lung problems, and kidney problems.  Possibility of pregnancy, if this applies. RISKS AND COMPLICATIONS  Infection. A germ starts growing in the wound. This can usually be treated with antibiotics. In some cases, the wound will need to be opened and cleaned.  Bleeding.  Damage to other organs.  Sores (abscesses).  Chronic pain at the incision sites. This is defined as pain that lasts for more than 3 months.  Blood clots in the legs that may rarely travel to the lungs.  Infection in the lungs (pneumonia). BEFORE THE PROCEDURE Appendectomy is usually performed immediately after an inflamed appendix (appendicitis) is diagnosed. No preparation is necessary ahead of this procedure. PROCEDURE  You will be given medicine that makes you sleep (general anesthetic). After you are asleep, a flexible tube (catheter) may be inserted into your bladder to drain your urine during surgery. The tube is removed before you wake up after surgery. When you are asleep, carbondioxide gas will be used to inflate your abdomen. This will allow your surgeon to see inside your abdomen and perform your surgery. Three small incisions will be made in your abdomen. Your surgeon will insert a thin, lighted tube (laparoscope) through one of the incisions. Your surgeon will look  through the laparoscope while performing the surgery. Other tools will be inserted through the other incisions. Laparoscopic procedures may not be appropriate when:  There is major scarring from a previous surgery.  The patient has bleeding disorders.  A pregnancy is near term.  There are other conditions which make the laparoscopic procedure impossible, such as an advanced infection or a ruptured appendix. If your surgeon feels it is not safe to continue with the laparoscopic procedure, he or she will perform an open surgery instead. This gives the surgeon a larger view and more space to work. Open surgery requires a longer recovery time. After your appendix is removed, your incisions will be closed with stitches (sutures) or skin adhesive. AFTER THE PROCEDURE You will be taken to a recovery room. When the anesthesia has worn off, you will be returned to your hospital room. You will be given pain medicines to keep you comfortable. Ask your caregiver how long your hospital stay will be.   This information is not intended to replace advice given to you by your health care provider. Make sure you discuss any questions you have with your health care provider.   Document Released: 09/26/2003 Document Revised: 03/04/2014 Document Reviewed: 08/01/2014 Elsevier Interactive Patient Education 2016 [employer]  CCS ______[facility] LAPAROSCOPIC SURGERY: POST OP INSTRUCTIONS Always review your discharge instruction sheet given to you by the facility where your surgery was performed. IF YOU HAVE DISABILITY OR FAMILY LEAVE FORMS, YOU MUST BRING THEM TO THE OFFICE FOR PROCESSING.   DO NOT GIVE THEM TO YOUR DOCTOR.  1. A prescription for pain medication may be given to you upon discharge.  Take your pain medication as prescribed, if needed.  If narcotic pain  medicine is not needed, then you may take acetaminophen (Tylenol) or ibuprofen (Advil) as needed. 2. Take your usually  prescribed medications unless otherwise directed. 3. If you need a refill on your pain medication, please contact your pharmacy.  They will contact our office to request authorization. Prescriptions will not be filled after 5pm or on week-ends. 4. You should follow a light diet the first few days after arrival home, such as soup and crackers, etc.  Be sure to include lots of fluids daily. 5. Most patients will experience some swelling and bruising in the area of the incisions.  Ice packs will help.  Swelling and bruising can take several days to resolve.  6. It is common to experience some constipation if taking pain medication after surgery.  Increasing fluid intake and taking a stool softener (such as Colace) will usually help or prevent this problem from occurring.  A mild laxative (Milk of Magnesia or Miralax) should be taken according to package instructions if there are no bowel movements after 48 hours. 7. Unless discharge instructions indicate otherwise, you may remove your bandages 24-48 hours after surgery, and you may shower at that time.  You may have steri-strips (small skin tapes) in place directly over the incision.  These strips should be left on the skin for 7-10 days.  If your surgeon used skin glue on the incision, you may shower in 24 hours.  The glue will flake off over the next 2-3 weeks.  Any sutures or staples will be removed at the office during your follow-up visit. 8. ACTIVITIES:  You may resume regular (light) daily activities beginning the next day--such as daily self-care, walking, climbing stairs--gradually increasing activities as tolerated.  You may have sexual intercourse when it is comfortable.  Refrain from any heavy lifting or straining until approved by your doctor. a. You may drive when you are no longer taking prescription pain medication, you can comfortably wear a seatbelt, and you can safely maneuver your car and apply brakes. b. RETURN TO WORK:   __________________________________________________________ 9. You should see your doctor in the office for a follow-up appointment approximately 2-3 weeks after your surgery.  Make sure that you call for this appointment within a day or two after you arrive home to insure a convenient appointment time. 10. OTHER INSTRUCTIONS: __________________________________________________________________________________________________________________________ __________________________________________________________________________________________________________________________ WHEN TO CALL YOUR DOCTOR: 1. Fever over 101.0 2. Inability to urinate 3. Continued bleeding from incision. 4. Increased pain, redness, or drainage from the incision. 5. Increasing abdominal pain  The clinic staff is available to answer your questions during regular business hours.  Please dont hesitate to call and ask to speak to one of the nurses for clinical concerns.  If you have a medical emergency, go to the nearest emergency room or call 911.  A surgeon from [facility] is always on call at the hospital. [address], [address], [place], [place]  [address] ? [address] [address], [place], [place]   [address] [phone] ? [phone] ? FAX [phone] Web site: [email or web address]

## 2015-10-17 NOTE — Discharge Summary (Signed)
Physician Discharge Summary  Patient ID: Mary FusJennifer J Goodell MRN: [id number] DOB/AGE: 38-Nov-1977 38 y.o.  Admit date: 10/10/2015 Discharge date: 10/11/2015  Admission Diagnoses:  Acute appendicitis  Hx of Migraines  Discharge Diagnoses:  Same   Active Problems:   S/P laparoscopic appendectomy August 2017   Appendicitis   PROCEDURES: Laparoscopic appendectomy, 10/11/15, Dr. Sandie AnoMatthew Martin  Hospital Course:  40 year old female presents to the emergency room with upper abdominal pain described as burning has some nausea but no vomiting, some diarrhea. In the ED she described epigastric burning and nausea. Prevented her from sleeping last PM, the pain seems to be located more in the lower abdomen. Nothing made it better or worse.  Workup in the ED: She is afebrile vital signs are stable. Labs show a glucose slightly elevated at 145 CMP is otherwise normal. White count is 11.8 hematocrit and hemoglobin platelets are normal. CT scan shows the appendix 1.3 cm with some internal gas and also high density structure favoring appendicolith. There is also periappendiceal stranding, overall appearance is consistent with acute appendicitis we are asked to see. She was seen and taken to the OR that afternoon.  She did well with surgery and post op.  She was discharged home the following AM.    Condition on d/c:  Improved   CBC Latest Ref Rng & Units 10/10/2015 03/23/2015 01/16/2015  WBC 4.0 - 10.5 K/uL 11.8(H) - 9.2  Hemoglobin 12.0 - 15.0 g/dL 78.212.5 95.613.6 21.313.2  Hematocrit 36.0 - 46.0 % 37.0 - 39.9  Platelets 150 - 400 K/uL 259 - 287   CMP Latest Ref Rng & Units 10/10/2015 01/16/2015 03/01/2011  Glucose 65 - 99 mg/dL 086(V145(H) 784(O102(H) 962(X102(H)  BUN 6 - 20 mg/dL 14 16 10   Creatinine 0.44 - 1.00 mg/dL 5.280.76 4.130.88 2.440.59  Sodium 135 - 145 mmol/L 136 139 135  Potassium 3.5 - 5.1 mmol/L 3.7 3.7 4.6  Chloride 101 - 111 mmol/L 102 101 99  CO2 22 - 32 mmol/L 26 30 27   Calcium 8.9 - 10.3 mg/dL 9.2 9.5 9.7  Total  Protein 6.5 - 8.1 g/dL 7.7 - 7.1  Total Bilirubin 0.3 - 1.2 mg/dL 0.3 - 0.3  Alkaline Phos 38 - 126 U/L 80 - 69  AST 15 - 41 U/L 29 - 24  ALT 14 - 54 U/L 22 - 22    Disposition: 01-Home or Self Care     Medication List    TAKE these medications   APAP 325 MG tablet He can take 2 tablets every 4 hours as needed. Tylenol(acetaminophen) is in your prescription pain medication. You cannot take more than 4000 mg of Tylenol/acetaminophen per day. You need to count the contents of each tablet to calculate dose for 24 hour period. What changed:  medication strength  how much to take  how to take this  when to take this  reasons to take this  additional instructions   clobetasol 0.05 % external solution Commonly known as:  TEMOVATE Apply 1 application topically 2 (two) times daily as needed (psoriasis).   escitalopram 20 MG tablet Commonly known as:  LEXAPRO Take 10 mg by mouth at bedtime.   famotidine 40 MG tablet Commonly known as:  PEPCID Take 40 mg by mouth daily as needed for heartburn or indigestion.   HYDROcodone-acetaminophen 5-325 MG tablet Commonly known as:  NORCO/VICODIN Take 1-2 tablets by mouth every 4 (four) hours as needed for moderate pain.   indomethacin 25 MG capsule Commonly known as:  INDOCIN One  po tid prn with food What changed:  how much to take  how to take this  when to take this  reasons to take this  additional instructions   LORazepam 0.5 MG tablet Commonly known as:  ATIVAN Take 0.5 mg by mouth every 8 (eight) hours as needed for anxiety (for flying).   multivitamin with minerals Tabs tablet Take 1 tablet by mouth daily.   norgestimate-ethinyl estradiol 0.25-35 MG-MCG tablet Commonly known as:  ORTHO-CYCLEN,SPRINTEC,PREVIFEM Take 1 tablet by mouth every morning.   promethazine 25 MG tablet Commonly known as:  PHENERGAN Take 1 tablet (25 mg total) by mouth every 6 (six) hours as needed for nausea or vomiting.    SUMAtriptan 6 MG/0.5ML Soaj Inject 6 mg into the skin daily as needed (migraine). What changed:  Another medication with the same name was changed. Make sure you understand how and when to take each.   SUMAtriptan 100 MG tablet Commonly known as:  IMITREX TAK ONE AT ONSET OF MIGRAINE, MAY REPEAT IN 2 HOURS IF HEADACHE PERSISTS OR RECURS. What changed:  See the new instructions.   tiZANidine 4 MG tablet Commonly known as:  ZANAFLEX Take 2-4 mg by mouth at bedtime as needed for muscle spasms.   vitamin B-12 100 MCG tablet Commonly known as:  CYANOCOBALAMIN Take 250 mcg by mouth daily.      Follow-up Information    ANDERSON,TERESA, FNP .   Specialty:  Nurse Practitioner Why:  Let them know you had surgery and follow-up for any medical issues. Contact information: [address] [place] [address] [phone]        [facility] Follow up on 10/24/2015.   Specialty:  General Surgery Why:  Your appointment is at 2:45 PM, either 30 minutes early for check-in. Contact information: [address] [place] [address] [phone]           Signed: Sherrie GeorgeJENNINGS,Braeden Kennan 10/17/2015, 4:14 PM

## 2015-10-25 ENCOUNTER — Other Ambulatory Visit: Payer: Self-pay | Admitting: Neurology

## 2015-11-01 ENCOUNTER — Telehealth: Payer: Self-pay | Admitting: *Deleted

## 2015-11-01 MED ORDER — SUMATRIPTAN SUCCINATE 6 MG/0.5ML ~~LOC~~ SOAJ: SUBCUTANEOUS | 5 refills | Status: DC

## 2015-11-01 NOTE — Telephone Encounter (Signed)
Pt. prefers SQ Imitrex.  Rx. escrib[facility]

## 2015-11-14 ENCOUNTER — Encounter: Payer: Self-pay | Admitting: Neurology

## 2015-12-31 ENCOUNTER — Encounter (HOSPITAL_COMMUNITY): Payer: Self-pay | Admitting: Emergency Medicine

## 2015-12-31 ENCOUNTER — Emergency Department (HOSPITAL_COMMUNITY)
Admission: EM | Admit: 2015-12-31 | Discharge: 2015-12-31 | Disposition: A | Discharge status: Home / Self Care | Payer: Medicaid Other | Attending: Emergency Medicine | Admitting: Emergency Medicine

## 2015-12-31 DIAGNOSIS — Z87891 Personal history of nicotine dependence: Secondary | ICD-10-CM | POA: Insufficient documentation

## 2015-12-31 DIAGNOSIS — M542 Cervicalgia: Secondary | ICD-10-CM | POA: Diagnosis present

## 2015-12-31 DIAGNOSIS — M5412 Radiculopathy, cervical region: Secondary | ICD-10-CM

## 2015-12-31 DIAGNOSIS — Z79899 Other long term (current) drug therapy: Secondary | ICD-10-CM | POA: Diagnosis not present

## 2015-12-31 MED ORDER — HYDROCODONE-ACETAMINOPHEN 5-325 MG PO TABS: 1.0000 | ORAL_TABLET | Freq: Four times a day (QID) | ORAL | 0 refills | Status: DC | PRN

## 2015-12-31 MED ORDER — KETOROLAC TROMETHAMINE 30 MG/ML IJ SOLN
30.0000 mg | Freq: Once | INTRAMUSCULAR | Status: AC
  Administered 2015-12-31: 30 mg via INTRAMUSCULAR
  Filled 2015-12-31: qty 1

## 2015-12-31 NOTE — ED Triage Notes (Signed)
Information from triage entered by Randye LoboP Morgana Rowley RN, not Sherlynn CarbonM Simmons, NT

## 2015-12-31 NOTE — ED Triage Notes (Signed)
Pt states she is having upper back and neck pain, tingling in rt arm. Pt has taken some muscle relaxer she had left over from other problems, She has spoke with her neurologist. Sates the pain keeps her from sleeping.
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
## 2015-12-31 NOTE — Discharge Instructions (Signed)
This is likely worsening radicular pain. Please take the norco as needed for pain. Do not drive with this medication. You need to call your neurosurgeon in the AM. Return to the ED if your symptoms worsen.

## 2016-01-08 ENCOUNTER — Other Ambulatory Visit: Payer: Self-pay | Admitting: Obstetrics and Gynecology

## 2016-01-23 ENCOUNTER — Ambulatory Visit (INDEPENDENT_AMBULATORY_CARE_PROVIDER_SITE_OTHER): Payer: Medicaid Other | Admitting: Neurology

## 2016-01-23 ENCOUNTER — Encounter: Payer: Self-pay | Admitting: Neurology

## 2016-01-23 VITALS — BP 112/72 | HR 80 | Resp 14 | Ht 63.0 in | Wt 156.0 lb

## 2016-01-23 DIAGNOSIS — G43009 Migraine without aura, not intractable, without status migrainosus: Secondary | ICD-10-CM | POA: Diagnosis not present

## 2016-01-23 DIAGNOSIS — G8929 Other chronic pain: Secondary | ICD-10-CM | POA: Diagnosis not present

## 2016-01-23 DIAGNOSIS — M542 Cervicalgia: Secondary | ICD-10-CM | POA: Insufficient documentation

## 2016-01-23 DIAGNOSIS — M546 Pain in thoracic spine: Secondary | ICD-10-CM

## 2016-01-23 MED ORDER — LEVETIRACETAM 750 MG PO TABS
750.0000 mg | ORAL_TABLET | Freq: Two times a day (BID) | ORAL | 5 refills | Status: DC
Start: 2016-01-23 — End: 2017-03-10
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
## 2016-01-31 ENCOUNTER — Encounter: Payer: Self-pay | Admitting: Neurology

## 2016-02-01 ENCOUNTER — Encounter: Payer: Self-pay | Admitting: Neurology

## 2016-02-02 ENCOUNTER — Telehealth: Payer: Self-pay | Admitting: *Deleted

## 2016-02-02 ENCOUNTER — Encounter: Payer: Self-pay | Admitting: Neurology

## 2016-02-02 MED ORDER — TRAMADOL HCL 50 MG PO TABS: ORAL_TABLET | ORAL | 0 refills | Status: DC

## 2016-02-02 NOTE — Telephone Encounter (Signed)
I have spoken with Victorino DikeJennifer this afternoon.  She c/o frequent migraines over the last few days--per RAS, ok to continue Indocin, may add Tramadol 50mg , one po q6hrs prn.  She is agreeable with this plan.  Tramadol rx. faxed to [facility] per her request/fim

## 2016-02-02 NOTE — Telephone Encounter (Signed)
Patient is calling stating she has sent emails but no response. She wants someone to check emails and call her back.

## 2016-02-12 ENCOUNTER — Telehealth: Payer: Self-pay | Admitting: Neurology

## 2016-02-12 NOTE — Telephone Encounter (Signed)
Patients botox has been denied and is requiring a p2p. They are stating that she does not meet medical necessity based on the clinicals submitted. Please call [phone] and ask for the pharmacy according to the representative. There is not time limit on the P2P but the patient does have an upcoming apt. Let me know if you have any questions!

## 2016-02-14 ENCOUNTER — Other Ambulatory Visit: Payer: Self-pay | Admitting: Obstetrics and Gynecology

## 2016-02-15 ENCOUNTER — Encounter: Payer: Self-pay | Admitting: Neurology

## 2016-02-15 LAB — CYTOLOGY - PAP

## 2016-02-16 ENCOUNTER — Ambulatory Visit: Payer: Medicaid Other | Admitting: Neurology

## 2016-03-15 NOTE — Telephone Encounter (Signed)
[name], I got this approved:  # [id number]   [id number]   Approved x 180 days

## 2016-03-18 NOTE — Telephone Encounter (Signed)
I called the patient to schedule injection, she did not answer so I left a VM asking for her to call me back.

## 2016-03-18 NOTE — Telephone Encounter (Signed)
Thank you Dr. Sater.

## 2016-03-19 ENCOUNTER — Telehealth: Payer: Self-pay | Admitting: Neurology

## 2016-03-19 NOTE — Telephone Encounter (Signed)
Patient is returning your call.  

## 2016-04-09 ENCOUNTER — Telehealth: Payer: Self-pay | Admitting: Neurology

## 2016-04-09 NOTE — Telephone Encounter (Signed)
I called and discussed injections with patient, we are going to try and get them approved again. I told her I will call her when I hear back from insurance.

## 2016-05-15 ENCOUNTER — Other Ambulatory Visit: Payer: Self-pay | Admitting: Neurology

## 2016-05-22 ENCOUNTER — Encounter: Payer: Self-pay | Admitting: Neurology

## 2016-05-23 NOTE — Telephone Encounter (Signed)
The authorization for this botox has been approved and the auth number is [id number] and it expires on 09/11/16.

## 2016-05-23 NOTE — Telephone Encounter (Signed)
I called patient to schedule injections. She did not answer so I left a VM asking her to call me back.   Late entry; I returned patients call in January to inquire if she wanted to try and submit for injections again and she stated that she did.

## 2016-05-30 ENCOUNTER — Encounter: Payer: Self-pay | Admitting: Neurology

## 2016-05-30 ENCOUNTER — Ambulatory Visit (INDEPENDENT_AMBULATORY_CARE_PROVIDER_SITE_OTHER): Payer: Medicaid Other | Admitting: Neurology

## 2016-05-30 VITALS — BP 123/72 | HR 63 | Resp 16 | Ht 63.0 in | Wt 156.0 lb

## 2016-05-30 DIAGNOSIS — F411 Generalized anxiety disorder: Secondary | ICD-10-CM | POA: Diagnosis not present

## 2016-05-30 DIAGNOSIS — M542 Cervicalgia: Secondary | ICD-10-CM

## 2016-05-30 DIAGNOSIS — M546 Pain in thoracic spine: Secondary | ICD-10-CM | POA: Diagnosis not present

## 2016-05-30 DIAGNOSIS — G43709 Chronic migraine without aura, not intractable, without status migrainosus: Secondary | ICD-10-CM

## 2016-05-30 DIAGNOSIS — G8929 Other chronic pain: Secondary | ICD-10-CM

## 2016-05-30 DIAGNOSIS — IMO0002 Reserved for concepts with insufficient information to code with codable children: Secondary | ICD-10-CM
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
## 2016-05-30 NOTE — Telephone Encounter (Signed)
Per Dr. Epimenio Foot, pt should return in about 3 months (around 08/29/2016) for botox.

## 2016-06-16 ENCOUNTER — Other Ambulatory Visit: Payer: Self-pay | Admitting: Neurology

## 2016-09-02 ENCOUNTER — Other Ambulatory Visit: Payer: Self-pay | Admitting: Neurology

## 2016-11-07 ENCOUNTER — Other Ambulatory Visit: Payer: Self-pay | Admitting: Neurology

## 2017-02-27 ENCOUNTER — Ambulatory Visit: Payer: Self-pay | Admitting: Surgery

## 2017-03-14 NOTE — Patient Instructions (Addendum)
FERNANDE TREIBER  03/14/2017   Your procedure is scheduled on: 03/20/2017   Report to [facility] Main  Entrance  Report to admitting at    1245 pm   Call this number if you have problems the morning of surgery [phone]   Remember: Do not eat food or drink liquids :After Midnight.          Rectal Prep:         Liquids 1-2 days prior to surgery.        Day prior to surgery - liquids and pureed foods only.      At 100pm day prior to surgery - 2 ounces ( 8Tablespoons ) Milk of Magnesia. Repeat in 2 hours if no effect.       Morning of surgery - Fleets enema.      Take these medicines the morning of surgery with A SIP OF WATER: Clonazepam if needed                                 You may not have any metal on your body including hair pins and              piercings  Do not wear jewelry, make-up, lotions, powders or perfumes, deodorant             Do not wear nail polish.  Do not shave  48 hours prior to surgery.              Do not bring valuables to the hospital. [facility] IS NOT             RESPONSIBLE   FOR VALUABLES.  Contacts, dentures or bridgework may not be worn into surgery.      Patients discharged the day of surgery will not be allowed to drive home.  Name and phone number of your driver:                Please read over the following fact sheets you were given: _____________________________________________________________________             [facility] - Preparing for Surgery Before surgery, you can play an important role.  Because skin is not sterile, your skin needs to be as free of germs as possible.  You can reduce the number of germs on your skin by washing with CHG (chlorahexidine gluconate) soap before surgery.  CHG is an antiseptic cleaner which kills germs and bonds with the skin to continue killing germs even after washing. Please DO NOT use if you have an allergy to CHG or antibacterial soaps.  If your skin becomes  reddened/irritated stop using the CHG and inform your nurse when you arrive at Short Stay. Do not shave (including legs and underarms) for at least 48 hours prior to the first CHG shower.  You may shave your face/neck. Please follow these instructions carefully:  1.  Shower with CHG Soap the night before surgery and the  morning of Surgery.  2.  If you choose to wash your hair, wash your hair first as usual with your  normal  shampoo.  3.  After you shampoo, rinse your hair and body thoroughly to remove the  shampoo.  4.  Use CHG as you would any other liquid soap.  You can apply chg directly  to the skin and wash                       Gently with a scrungie or clean washcloth.  5.  Apply the CHG Soap to your body ONLY FROM THE NECK DOWN.   Do not use on face/ open                           Wound or open sores. Avoid contact with eyes, ears mouth and genitals (private parts).                       Wash face,  Genitals (private parts) with your normal soap.             6.  Wash thoroughly, paying special attention to the area where your surgery  will be performed.  7.  Thoroughly rinse your body with warm water from the neck down.  8.  DO NOT shower/wash with your normal soap after using and rinsing off  the CHG Soap.                9.  Pat yourself dry with a clean towel.            10.  Wear clean pajamas.            11.  Place clean sheets on your bed the night of your first shower and do not  sleep with pets. Day of Surgery : Do not apply any lotions/deodorants the morning of surgery.  Please wear clean clothes to the hospital/surgery center.  FAILURE TO FOLLOW THESE INSTRUCTIONS MAY RESULT IN THE CANCELLATION OF YOUR SURGERY PATIENT SIGNATURE_________________________________  NURSE SIGNATURE__________________________________  ________________________________________________________________________ NO SOLID FOOD AFTER MIDNIGHT THE NIGHT PRIOR TO SRRGERY. NOTHING  BY MOUTH EXCEPT CLEAR LIQUIDS UNTIL 3 HOURS PRIOR TO SCHEULED SURGERY. PLEASE FINISH ENSURE DRINK PER SURGEON ORDER 3 HOURS PRIOR TO SCHEDULED SURGERY TIME WHICH NEEDS TO BE COMPLETED AT ___1145_________.

## 2017-03-18 ENCOUNTER — Encounter (HOSPITAL_COMMUNITY)
Admission: RE | Admit: 2017-03-18 | Discharge: 2017-03-18 | Disposition: A | Discharge status: Home / Self Care | Payer: Medicaid Other | Source: Ambulatory Visit | Attending: Nurse Practitioner | Admitting: Nurse Practitioner

## 2017-04-10 ENCOUNTER — Encounter (HOSPITAL_COMMUNITY)
Admission: RE | Admit: 2017-04-10 | Discharge: 2017-04-10 | Disposition: A | Discharge status: Home / Self Care | Payer: Medicaid Other | Source: Ambulatory Visit | Attending: Surgery | Admitting: Surgery

## 2017-04-10 ENCOUNTER — Other Ambulatory Visit: Payer: Self-pay

## 2017-04-10 ENCOUNTER — Encounter (HOSPITAL_COMMUNITY): Payer: Self-pay

## 2017-04-10 DIAGNOSIS — K644 Residual hemorrhoidal skin tags: Secondary | ICD-10-CM | POA: Diagnosis not present

## 2017-04-10 DIAGNOSIS — Z01812 Encounter for preprocedural laboratory examination: Secondary | ICD-10-CM | POA: Insufficient documentation

## 2017-04-10 LAB — CBC
HCT: 39.1 % (ref 36.0–46.0)
Hemoglobin: 12.9 g/dL (ref 12.0–15.0)
MCH: 30.1 pg (ref 26.0–34.0)
MCHC: 33 g/dL (ref 30.0–36.0)
MCV: 91.1 fL (ref 78.0–100.0)
PLATELETS: 300 10*3/uL (ref 150–400)
RBC: 4.29 MIL/uL (ref 3.87–5.11)
RDW: 13 % (ref 11.5–15.5)
WBC: 5.8 10*3/uL (ref 4.0–10.5)

## 2017-04-10 LAB — HCG, SERUM, QUALITATIVE: PREG SERUM: NEGATIVE
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
## 2017-04-10 NOTE — Patient Instructions (Signed)
[name]  04/10/2017   Your procedure is scheduled on: 04-16-17   Report to [facility] Main  Entrance    Report to admitting at 10:00AM   Call this number if you have problems the morning of surgery [phone]        Take these medicines the morning of surgery with A SIP OF WATER: clonazepam(klonopin)                                You may not have any metal on your body including hair pins and              piercings  Do not wear jewelry, make-up, lotions, powders or perfumes, deodorant             Do not wear nail polish.  Do not shave  48 hours prior to surgery.            Do not bring valuables to the hospital. [facility] IS NOT             RESPONSIBLE   FOR VALUABLES.  Contacts, dentures or bridgework may not be worn into surgery.       Patients discharged the day of surgery will not be allowed to drive home.  Name and phone number of your driver:  Special Instructions: N/A              Please read over the following fact sheets you were given: _____________________________________________________________________  [facility] PREP INSTRUCTIONS for Anal/Rectal Surgery:  Focus on drinking liquids for 1-2 days prior to surgery Obtain what you need at a pharmacy of your choice:  A bottle of Milk of Magnesia   DAY PRIOR TO SURGERY on Tuesday 04-15-17:  Switch to drinking liquids or pureed foods only  1:00pm   Take 2 oz (8 tablespoons) Milk of Magnesia. (Repeat if no effect in 2 hours)   Midnight: Do not eat  anything after midnight the night before your surgery. Continue clear liquids only!   MORNING OF PROCEDURE:  Remember : NOTHING BY MOUTH EXCEPT CLEAR LIQUIDS UNTIL 3 HOURS PRIOR TO SCHEDULED SURGERY. PLEASE FINISH ENSURE DRINK PER SURGEON ORDER 3 HOURS PRIOR TO SCHEDULED SURGERY TIME WHICH NEEDS TO BE COMPLETED AT _____9:00AM______.   Take a Fleet enema!!!    If you have questions or problems, please call [facility] [phone] speak to someone in the clinic department at our office    CLEAR LIQUID DIET   Foods Allowed                                                                     Foods Excluded  Coffee and tea, regular and decaf                             liquids that you cannot  Plain Jell-O in any flavor  see through such as: Fruit ices (not with fruit pulp)                                     milk, soups, orange juice  Iced Popsicles                                    All solid food Carbonated beverages, regular and diet                                    Cranberry, grape and apple juices Sports drinks like Gatorade Lightly seasoned clear broth or consume(fat free) Sugar, honey syrup  Sample Menu Breakfast                                Lunch                                     Supper Cranberry juice                    Beef broth                            Chicken broth Jell-O                                     Grape juice                           Apple juice Coffee or tea                        Jell-O                                      Popsicle                                                Coffee or tea                        Coffee or tea  _____________________________________________________________________  [facility] - Preparing for Surgery Before surgery, you can play an important role.  Because skin is not sterile, your skin needs to be as free of germs as possible.  You can reduce the number of germs on your skin by washing with CHG (chlorahexidine gluconate) soap before surgery.  CHG is an antiseptic cleaner which kills germs and bonds with the skin to continue killing germs even after washing. Please DO NOT use if you have an allergy to CHG or antibacterial soaps.  If your skin becomes reddened/irritated stop using the CHG and inform your nurse when you arrive at Short Stay. Do not shave (including legs and  underarms)  for at least 48 hours prior to the first CHG shower.  You may shave your face/neck. Please follow these instructions carefully:  1.  Shower with CHG Soap the night before surgery and the  morning of Surgery.  2.  If you choose to wash your hair, wash your hair first as usual with your  normal  shampoo.  3.  After you shampoo, rinse your hair and body thoroughly to remove the  shampoo.                           4.  Use CHG as you would any other liquid soap.  You can apply chg directly  to the skin and wash                       Gently with a scrungie or clean washcloth.  5.  Apply the CHG Soap to your body ONLY FROM THE NECK DOWN.   Do not use on face/ open                           Wound or open sores. Avoid contact with eyes, ears mouth and genitals (private parts).                       Wash face,  Genitals (private parts) with your normal soap.             6.  Wash thoroughly, paying special attention to the area where your surgery  will be performed.  7.  Thoroughly rinse your body with warm water from the neck down.  8.  DO NOT shower/wash with your normal soap after using and rinsing off  the CHG Soap.                9.  Pat yourself dry with a clean towel.            10.  Wear clean pajamas.            11.  Place clean sheets on your bed the night of your first shower and do not  sleep with pets. Day of Surgery : Do not apply any lotions/deodorants the morning of surgery.  Please wear clean clothes to the hospital/surgery center.  FAILURE TO FOLLOW THESE INSTRUCTIONS MAY RESULT IN THE CANCELLATION OF YOUR SURGERY PATIENT SIGNATURE_________________________________  NURSE SIGNATURE__________________________________  ________________________________________________________________________

## 2017-04-15 MED ORDER — BUPIVACAINE LIPOSOME 1.3 % IJ SUSP
20.0000 mL | INTRAMUSCULAR | Status: DC
  Filled 2017-04-15: qty 20

## 2017-04-16 ENCOUNTER — Ambulatory Visit (HOSPITAL_COMMUNITY)
Admission: RE | Admit: 2017-04-16 | Discharge: 2017-04-16 | Disposition: A | Discharge status: Home / Self Care | Payer: Medicaid Other | Source: Ambulatory Visit | Attending: Surgery | Admitting: Surgery

## 2017-04-16 ENCOUNTER — Encounter (HOSPITAL_COMMUNITY)
Admission: RE | Disposition: A | Discharge status: Home / Self Care | Payer: Self-pay | Source: Ambulatory Visit | Attending: Surgery

## 2017-04-16 ENCOUNTER — Encounter (HOSPITAL_COMMUNITY): Payer: Self-pay

## 2017-04-16 ENCOUNTER — Ambulatory Visit (HOSPITAL_COMMUNITY): Payer: Medicaid Other | Admitting: Certified Registered Nurse Anesthetist

## 2017-04-16 DIAGNOSIS — K642 Third degree hemorrhoids: Secondary | ICD-10-CM | POA: Insufficient documentation

## 2017-04-16 DIAGNOSIS — K644 Residual hemorrhoidal skin tags: Secondary | ICD-10-CM | POA: Insufficient documentation

## 2017-04-16 DIAGNOSIS — K649 Unspecified hemorrhoids: Secondary | ICD-10-CM | POA: Diagnosis present

## 2017-04-16 DIAGNOSIS — Z793 Long term (current) use of hormonal contraceptives: Secondary | ICD-10-CM | POA: Diagnosis not present

## 2017-04-16 DIAGNOSIS — Z79899 Other long term (current) drug therapy: Secondary | ICD-10-CM | POA: Diagnosis not present

## 2017-04-16 DIAGNOSIS — G43909 Migraine, unspecified, not intractable, without status migrainosus: Secondary | ICD-10-CM | POA: Insufficient documentation

## 2017-04-16 DIAGNOSIS — L409 Psoriasis, unspecified: Secondary | ICD-10-CM | POA: Diagnosis not present

## 2017-04-16 DIAGNOSIS — Z87891 Personal history of nicotine dependence: Secondary | ICD-10-CM | POA: Diagnosis not present

## 2017-04-16 DIAGNOSIS — F329 Major depressive disorder, single episode, unspecified: Secondary | ICD-10-CM | POA: Diagnosis not present

## 2017-04-16 HISTORY — PX: EVALUATION UNDER ANESTHESIA WITH HEMORRHOIDECTOMY: SHX5624

## 2017-04-16 HISTORY — DX: Residual hemorrhoidal skin tags: K64.4

## 2017-04-16 SURGERY — EXAM UNDER ANESTHESIA WITH HEMORRHOIDECTOMY
Anesthesia: General | Site: Rectum

## 2017-04-16 MED ORDER — KETAMINE HCL 10 MG/ML IJ SOLN
INTRAMUSCULAR | Status: DC | PRN
  Administered 2017-04-16: 25 mg via INTRAVENOUS

## 2017-04-16 MED ORDER — HYDROMORPHONE HCL 1 MG/ML IJ SOLN: 0.2500 mg | INTRAMUSCULAR | Status: DC | PRN

## 2017-04-16 MED ORDER — SUCCINYLCHOLINE CHLORIDE 200 MG/10ML IV SOSY
PREFILLED_SYRINGE | INTRAVENOUS | Status: AC
  Filled 2017-04-16: qty 10

## 2017-04-16 MED ORDER — SUCCINYLCHOLINE CHLORIDE 200 MG/10ML IV SOSY
PREFILLED_SYRINGE | INTRAVENOUS | Status: DC | PRN
  Administered 2017-04-16: 100 mg via INTRAVENOUS

## 2017-04-16 MED ORDER — 0.9 % SODIUM CHLORIDE (POUR BTL) OPTIME
TOPICAL | Status: DC | PRN
  Administered 2017-04-16: 1000 mL

## 2017-04-16 MED ORDER — PROPOFOL 10 MG/ML IV BOLUS
INTRAVENOUS | Status: DC | PRN
  Administered 2017-04-16: 200 mg via INTRAVENOUS

## 2017-04-16 MED ORDER — DEXAMETHASONE SODIUM PHOSPHATE 10 MG/ML IJ SOLN
INTRAMUSCULAR | Status: AC
  Filled 2017-04-16: qty 1

## 2017-04-16 MED ORDER — LIDOCAINE 2% (20 MG/ML) 5 ML SYRINGE
INTRAMUSCULAR | Status: DC | PRN
  Administered 2017-04-16: 80 mg via INTRAVENOUS

## 2017-04-16 MED ORDER — CHLORHEXIDINE GLUCONATE CLOTH 2 % EX PADS: 6.0000 | MEDICATED_PAD | Freq: Once | CUTANEOUS | Status: DC

## 2017-04-16 MED ORDER — SUGAMMADEX SODIUM 200 MG/2ML IV SOLN
INTRAVENOUS | Status: AC
  Filled 2017-04-16: qty 2

## 2017-04-16 MED ORDER — CELECOXIB 200 MG PO CAPS
200.0000 mg | ORAL_CAPSULE | ORAL | Status: AC
  Administered 2017-04-16: 200 mg via ORAL
  Filled 2017-04-16: qty 1

## 2017-04-16 MED ORDER — ALBUTEROL SULFATE HFA 108 (90 BASE) MCG/ACT IN AERS
INHALATION_SPRAY | RESPIRATORY_TRACT | Status: AC
  Filled 2017-04-16: qty 6.7

## 2017-04-16 MED ORDER — LACTATED RINGERS IV SOLN
INTRAVENOUS | Status: DC | PRN
  Administered 2017-04-16: 11:00:00 via INTRAVENOUS

## 2017-04-16 MED ORDER — BUPIVACAINE-EPINEPHRINE 0.25% -1:200000 IJ SOLN
INTRAMUSCULAR | Status: AC
  Filled 2017-04-16: qty 1

## 2017-04-16 MED ORDER — BUPIVACAINE LIPOSOME 1.3 % IJ SUSP
INTRAMUSCULAR | Status: DC | PRN
  Administered 2017-04-16: 20 mL

## 2017-04-16 MED ORDER — LIDOCAINE 2% (20 MG/ML) 5 ML SYRINGE
INTRAMUSCULAR | Status: AC
  Filled 2017-04-16: qty 5

## 2017-04-16 MED ORDER — ROCURONIUM BROMIDE 50 MG/5ML IV SOSY
PREFILLED_SYRINGE | INTRAVENOUS | Status: DC | PRN
  Administered 2017-04-16: 20 mg via INTRAVENOUS

## 2017-04-16 MED ORDER — SUGAMMADEX SODIUM 200 MG/2ML IV SOLN
INTRAVENOUS | Status: DC | PRN
  Administered 2017-04-16: 150 mg via INTRAVENOUS

## 2017-04-16 MED ORDER — FENTANYL CITRATE (PF) 250 MCG/5ML IJ SOLN
INTRAMUSCULAR | Status: AC
Start: 2017-04-16 — End: 2017-04-16
  Filled 2017-04-16: qty 5

## 2017-04-16 MED ORDER — DIBUCAINE 1 % RE OINT
TOPICAL_OINTMENT | RECTAL | Status: DC | PRN
  Administered 2017-04-16: 1 via RECTAL

## 2017-04-16 MED ORDER — AMBULATORY NON FORMULARY MEDICATION: 1.0000 "application " | Freq: Four times a day (QID) | 2 refills | Status: DC

## 2017-04-16 MED ORDER — PROPOFOL 10 MG/ML IV BOLUS
INTRAVENOUS | Status: AC
  Filled 2017-04-16: qty 20

## 2017-04-16 MED ORDER — MEPERIDINE HCL 50 MG/ML IJ SOLN: 6.2500 mg | INTRAMUSCULAR | Status: DC | PRN

## 2017-04-16 MED ORDER — GABAPENTIN 300 MG PO CAPS
300.0000 mg | ORAL_CAPSULE | ORAL | Status: AC
  Administered 2017-04-16: 300 mg via ORAL
  Filled 2017-04-16: qty 1

## 2017-04-16 MED ORDER — OXYCODONE HCL 5 MG/5ML PO SOLN
5.0000 mg | Freq: Once | ORAL | Status: DC | PRN
  Filled 2017-04-16: qty 5

## 2017-04-16 MED ORDER — SODIUM CHLORIDE 0.9 % IJ SOLN
INTRAMUSCULAR | Status: AC
  Filled 2017-04-16: qty 50

## 2017-04-16 MED ORDER — ONDANSETRON HCL 4 MG/2ML IJ SOLN
INTRAMUSCULAR | Status: AC
  Filled 2017-04-16: qty 2

## 2017-04-16 MED ORDER — DEXAMETHASONE SODIUM PHOSPHATE 10 MG/ML IJ SOLN
INTRAMUSCULAR | Status: DC | PRN
  Administered 2017-04-16: 5 mg via INTRAVENOUS

## 2017-04-16 MED ORDER — OXYCODONE HCL 5 MG PO TABS: 5.0000 mg | ORAL_TABLET | Freq: Four times a day (QID) | ORAL | 0 refills | Status: DC | PRN

## 2017-04-16 MED ORDER — PROMETHAZINE HCL 25 MG/ML IJ SOLN: 6.2500 mg | INTRAMUSCULAR | Status: DC | PRN

## 2017-04-16 MED ORDER — DIBUCAINE 1 % RE OINT
TOPICAL_OINTMENT | RECTAL | Status: AC
  Filled 2017-04-16: qty 28

## 2017-04-16 MED ORDER — ONDANSETRON HCL 4 MG/2ML IJ SOLN
INTRAMUSCULAR | Status: DC | PRN
  Administered 2017-04-16: 4 mg via INTRAVENOUS

## 2017-04-16 MED ORDER — OXYCODONE HCL 5 MG PO TABS: 5.0000 mg | ORAL_TABLET | Freq: Once | ORAL | Status: DC | PRN

## 2017-04-16 MED ORDER — FENTANYL CITRATE (PF) 250 MCG/5ML IJ SOLN
INTRAMUSCULAR | Status: DC | PRN
  Administered 2017-04-16: 50 ug via INTRAVENOUS
  Administered 2017-04-16: 100 ug via INTRAVENOUS
  Administered 2017-04-16 (×2): 50 ug via INTRAVENOUS

## 2017-04-16 MED ORDER — MIDAZOLAM HCL 5 MG/5ML IJ SOLN
INTRAMUSCULAR | Status: DC | PRN
  Administered 2017-04-16: 2 mg via INTRAVENOUS

## 2017-04-16 MED ORDER — MIDAZOLAM HCL 2 MG/2ML IJ SOLN
INTRAMUSCULAR | Status: AC
  Filled 2017-04-16: qty 2

## 2017-04-16 MED ORDER — ROCURONIUM BROMIDE 10 MG/ML (PF) SYRINGE
PREFILLED_SYRINGE | INTRAVENOUS | Status: AC
  Filled 2017-04-16: qty 5

## 2017-04-16 MED ORDER — METRONIDAZOLE IN NACL 5-0.79 MG/ML-% IV SOLN
500.0000 mg | INTRAVENOUS | Status: AC
  Administered 2017-04-16: 500 mg via INTRAVENOUS
  Filled 2017-04-16: qty 100

## 2017-04-16 MED ORDER — BUPIVACAINE-EPINEPHRINE 0.25% -1:200000 IJ SOLN
INTRAMUSCULAR | Status: DC | PRN
  Administered 2017-04-16: 20 mL

## 2017-04-16 MED ORDER — ACETAMINOPHEN 500 MG PO TABS
1000.0000 mg | ORAL_TABLET | ORAL | Status: AC
  Administered 2017-04-16: 1000 mg via ORAL
  Filled 2017-04-16: qty 2

## 2017-04-16 MED ORDER — CEFAZOLIN SODIUM-DEXTROSE 2-4 GM/100ML-% IV SOLN
2.0000 g | INTRAVENOUS | Status: AC
  Administered 2017-04-16: 2 g via INTRAVENOUS
  Filled 2017-04-16: qty 100

## 2017-04-16 SURGICAL SUPPLY — 34 items
BLADE SURG 15 STRL LF DISP TIS (BLADE) ×1 IMPLANT
BLADE SURG 15 STRL SS (BLADE) ×2
BRIEF STRETCH FOR OB PAD LRG (UNDERPADS AND DIAPERS) ×3 IMPLANT
CONT SPEC 4OZ CLIKSEAL STRL BL (MISCELLANEOUS) ×3 IMPLANT
COVER SURGICAL LIGHT HANDLE (MISCELLANEOUS) ×3 IMPLANT
DECANTER SPIKE VIAL GLASS SM (MISCELLANEOUS) ×3 IMPLANT
DRAPE LAPAROTOMY T 102X78X121 (DRAPES) ×3 IMPLANT
DRSG PAD ABDOMINAL 8X10 ST (GAUZE/BANDAGES/DRESSINGS) IMPLANT
DRSG XEROFORM 1X8 (GAUZE/BANDAGES/DRESSINGS) ×3 IMPLANT
ELECT PENCIL ROCKER SW 15FT (MISCELLANEOUS) ×3 IMPLANT
ELECT REM PT RETURN 15FT ADLT (MISCELLANEOUS) ×3 IMPLANT
GAUZE SPONGE 4X4 12PLY STRL (GAUZE/BANDAGES/DRESSINGS) ×3 IMPLANT
GAUZE SPONGE 4X4 16PLY XRAY LF (GAUZE/BANDAGES/DRESSINGS) ×3 IMPLANT
GLOVE ECLIPSE 8.0 STRL XLNG CF (GLOVE) ×3 IMPLANT
GLOVE INDICATOR 8.0 STRL GRN (GLOVE) ×3 IMPLANT
GOWN STRL REUS W/TWL XL LVL3 (GOWN DISPOSABLE) ×6 IMPLANT
KIT BASIN OR (CUSTOM PROCEDURE TRAY) ×3 IMPLANT
LOOP VESSEL MAXI BLUE (MISCELLANEOUS) IMPLANT
LUBRICANT JELLY K Y 4OZ (MISCELLANEOUS) ×3 IMPLANT
NEEDLE HYPO 22GX1.5 SAFETY (NEEDLE) ×3 IMPLANT
PACK BASIC VI WITH GOWN DISP (CUSTOM PROCEDURE TRAY) ×3 IMPLANT
PAD ABD 7.5X8 STRL (GAUZE/BANDAGES/DRESSINGS) ×3 IMPLANT
SCRUB TECHNI CARE 4 OZ NO DYE (MISCELLANEOUS) ×3 IMPLANT
SHEARS HARMONIC 9CM CVD (BLADE) IMPLANT
SUT CHROMIC 2 0 SH (SUTURE) IMPLANT
SUT CHROMIC 3 0 SH 27 (SUTURE) ×3 IMPLANT
SUT VIC AB 2-0 SH 27 (SUTURE)
SUT VIC AB 2-0 SH 27X BRD (SUTURE) IMPLANT
SUT VIC AB 2-0 UR6 27 (SUTURE) ×3 IMPLANT
SYR 20CC LL (SYRINGE) ×3 IMPLANT
SYR 3ML LL SCALE MARK (SYRINGE) ×3 IMPLANT
TOWEL OR 17X26 10 PK STRL BLUE (TOWEL DISPOSABLE) ×3 IMPLANT
TOWEL OR NON WOVEN STRL DISP B (DISPOSABLE) ×3 IMPLANT
YANKAUER SUCT BULB TIP 10FT TU (MISCELLANEOUS) ×3 IMPLANT
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
## 2017-04-16 NOTE — Discharge Instructions (Signed)
ANORECTAL SURGERY:  °POST OPERATIVE INSTRUCTIONS ° °###################################################################### ° °EAT °Start with a pureed / full liquid diet °After 24 hours, gradually transition to a high fiber diet.   ° °CONTROL PAIN °Control pain so you can tolerate bowel movements,  °walk, sleep, tolerate sneezing/coughing, and go up/down stairs. ° ° °HAVE A BOWEL MOVEMENT DAILY °Keep your bowels regular to avoid problems.   °Taking a fiber supplement every day to keep bowels soft.   °Try a laxative to override constipation. °Use an antidairrheal to slow down diarrhea.   °Call if not better after 2 tries ° °WALK °Walk an hour a day.  Control your pain to do that. °  °CALL IF YOU HAVE PROBLEMS/CONCERNS °Call if you are still struggling despite following these instructions. °Call if you have concerns not answered by these instructions ° °###################################################################### ° ° ° °1. Take your usually prescribed home medications unless otherwise directed. °2. DIET: Follow a light bland diet the first 24 hours after arrival home, such as soup, liquids, crackers, etc.  Be sure to include lots of fluids daily.  Avoid fast food or heavy meals as your are more likely to get nauseated.  Eat a low fat the next few days after surgery.   °3. PAIN CONTROL: °a. Pain is best controlled by a usual combination of three different methods TOGETHER: °i. Ice/Heat °ii. Over the counter pain medication °iii. Prescription pain medication °b. Expect swelling and discomfort in the anus/rectal area.  Warm water baths (30-60 minutes up to 6 times a day, especially after bowel meovements) will help. Use ice for the first few days to help decrease swelling and bruising, then switch to heat such as warm towels, sitz baths, warm baths, etc to help relax tight/sore spots and speed recovery.  Some people prefer to use ice alone, heat alone, alternating between ice & heat.  Experiment to what works  for you.   °c. It is helpful to take an over-the-counter pain medication regularly for the first few weeks.  Choose one of the following that works best for you: °i. Naproxen (Aleve, etc)  Two 220mg tabs twice a day °ii. Ibuprofen (Advil, etc) Three 200mg tabs four times a day (every meal & bedtime) °iii. Acetaminophen (Tylenol, etc) 500-650mg four times a day (every meal & bedtime) °d. A  prescription for pain medication (such as oxycodone, hydrocodone, etc) should be given to you upon discharge.  Take your pain medication as prescribed.  °i. If you are having problems/concerns with the prescription medicine (does not control pain, nausea, vomiting, rash, itching, etc), please call us [phone] to see if we need to switch you to a different pain medicine that will work better for you and/or control your side effect better. °ii. If you need a refill on your pain medication, please contact your pharmacy.  They will contact our office to request authorization. Prescriptions will not be filled after 5 pm or on week-ends. ° °Use a Sitz Bath 4-8 times a day for relief ° ° °Sitz Bath °A sitz bath is a warm water bath taken in the sitting position that covers only the hips and buttocks. It may be used for either healing or hygiene purposes. Sitz baths are also used to relieve pain, itching, or muscle spasms. The water may contain medicine. Moist heat will help you heal and relax.  °HOME CARE INSTRUCTIONS  °Take 3 to 4 sitz baths a day. °1. Fill the bathtub half full with warm water. °2. Sit in the   water and open the drain a little. °3. Turn on the warm water to keep the tub half full. Keep the water running constantly. °4. Soak in the water for 15 to 20 minutes. °5. After the sitz bath, pat the affected area dry first. ° ° °4. KEEP YOUR BOWELS REGULAR °a. The goal is one bowel movement a day °b. Avoid getting constipated.  Between the surgery and the pain medications, it is common to experience some constipation.   Increasing fluid intake and taking a fiber supplement (such as Metamucil, Citrucel, FiberCon, MiraLax, etc) 2-3 times a day regularly will usually help prevent this problem from occurring.  A mild laxative (prune juice, Milk of Magnesia, MiraLax, etc) should be taken according to package directions if there are no bowel movements after 48 hours. °c. Watch out for diarrhea.  If you have many loose bowel movements, simplify your diet to bland foods & liquids for a few days.  Stop any stool softeners and decrease your fiber supplement.  Switching to mild anti-diarrheal medications (Kayopectate, Pepto Bismol) can help.  If this worsens or does not improve, please call us. ° °5. Wound Care ° °a. Remove your bandages with your first bowel movement, usually the day after surgery.  You may have packing if you had an abscess.  Let any packing or gauze fall come out.   °b. Wear an absorbent pad or soft cotton balls in your underwear as needed to catch any drainage and help keep the area  °c. Keep the area clean and dry.  Bathe / shower every day.  Keep the area clean by showering / bathing over the incision / wound.   It is okay to soak an open wound to help wash it.  Consider using a squeeze bottle filled with warm water to gently wash the anal area.  Wet wipes or showers / gentle washing after bowel movements is often less traumatic than regular toilet paper. °d. You will often notice bleeding with bowel movements.  This should slow down by the end of the first week of surgery.  Sitting on an ice pack can help. °e. Expect some drainage.  This should slow down by the end of the first week of surgery, but you will have occasional bleeding or drainage up to a few months after surgery.  Wear an absorbent pad or soft cotton gauze in your underwear until the drainage stops. ° °6. ACTIVITIES as tolerated:   °a. You may resume regular (light) daily activities beginning the next day--such as daily self-care, walking, climbing  stairs--gradually increasing activities as tolerated.  If you can walk 30 minutes without difficulty, it is safe to try more intense activity such as jogging, treadmill, bicycling, low-impact aerobics, swimming, etc. °b. Save the most intensive and strenuous activity for last such as sit-ups, heavy lifting, contact sports, etc  Refrain from any heavy lifting or straining until you are off narcotics for pain control.   °c. DO NOT PUSH THROUGH PAIN.  Let pain be your guide: If it hurts to do something, don't do it.  Pain is your body warning you to avoid that activity for another week until the pain goes down. °d. You may drive when you are no longer taking prescription pain medication, you can comfortably sit for long periods of time, and you can safely maneuver your car and apply brakes. °e. You may have sexual intercourse when it is comfortable.  °7. FOLLOW UP in our office °a. Please call [facility] at [phone] to set up an appointment to see your surgeon in the office for a follow-up appointment approximately 2-3 weeks after your surgery. °b. Make sure that you call for this appointment the day you arrive home to ensure a convenient appointment time. ° °8. IF YOU HAVE DISABILITY OR FAMILY LEAVE FORMS, BRING THEM TO THE OFFICE FOR PROCESSING.  DO NOT GIVE THEM TO YOUR DOCTOR. ° ° ° ° ° ° ° °WHEN TO CALL US [phone]: °1. Poor pain control °2. Reactions / problems with new medications (rash/itching, nausea, etc)  °3. Fever over 101.5 F (38.5 C) °4. Inability to urinate °5. Nausea and/or vomiting °6. Worsening swelling or bruising °7. Continued bleeding from incision. °8. Increased pain, redness, or drainage from the incision ° °The clinic staff is available to answer your questions during regular business hours (8:30am-5pm).  Please don’t hesitate to call and ask to speak to one of our nurses for clinical concerns.   A surgeon from [facility] is always on call at the hospitals °  °If you have a  medical emergency, go to the nearest emergency room or call 911. °  ° °[facility] °[address], [address], [place], [place]  [address] ? °MAIN: [phone] ? TOLL FREE: [phone] ? °FAX [phone] °[email or web address] ° ° °HEMORRHOIDS  °The rectum is the last foot of your colon, and it naturally stretches to hold stool.  Hemorrhoidal piles are natural clusters of blood vessels that help the rectum and anal canal stretch to hold stool and allow bowel movements to eliminate feces.   °Hemorrhoids are abnormally swollen blood vessels in the rectum.  Too much pressure in the rectum causes hemorrhoids by forcing blood to stretch and bulge the walls of the veins, sometimes even rupturing them.  Hemorrhoids can become like varicose veins you might see on a person's legs.  °Most people will develop a flare of hemorrhoids in their lifetime.  When bulging hemorrhoidal veins are irritated, they can swell, burn, itch, cause pain, and bleed.  Most flares will calm down gradually own within a few weeks.  However, once hemorrhoids are created, they are difficult to get rid of completely and tend to flare more easily than the first flare.   Fortunately, good habits and simple medical treatment usually control hemorrhoids well, and surgery is needed only in severe cases. °Types of Hemorrhoids:  °Internal hemorrhoids usually don't initially hurt or itch; they are deep inside the rectum and usually have no sensation. If they begin to push out (prolapse), pain and burning can occur.  However, internal hemorrhoids can bleed.  Anal bleeding should not be ignored since bleeding could come from a dangerous source like colorectal cancer, so persistent rectal bleeding should be investigated by a doctor, sometimes with a colonoscopy.  °External hemorrhoids cause most of the symptoms - pain, burning, and itching. Nonirritated hemorrhoids can look like small skin tags coming out of the anus.     °Thrombosed hemorrhoids can form when a hemorrhoid blood vessel bursts and causes the hemorrhoid to suddenly swell.  A purple blood clot can form in it and become an excruciatingly painful lump at the anus. Because of these unpleasant symptoms, immediate incision and drainage by a surgeon at an office visit can provide much relief of the pain.   ° °PREVENTION °Avoiding the most frequent causes listed below will prevent most cases of hemorrhoids: °Constipation °Hard stools °Diarrhea  °Constant sitting  °Straining with bowel movements °Sitting   on the toilet for a long time  °Severe coughing  episodes °Pregnancy / Childbirth  °Heavy Lifting  °Sometimes avoiding the above triggers is difficult:  How can you avoid sitting all day if you have a seated job? Also, we try to avoid coughing and diarrhea, but sometimes it’s beyond your control.  Still, there are some practical hints to help: °Keep the anal and genital area clean.  Moistened tissues such as flushable wet wipes are less irritating than toilet paper.  Using irrigating showers or bottle irrigation washing gently cleans this sensitive area.   Avoid dry toilet paper when cleaning after bowel movements.  . °Keep the anal and genital area dry.  Lightly pat the rectal area dry.  Avoid rubbing.  Talcum or baby powders can help °GET YOUR STOOLS SOFT.   This is the most important way to prevent irritated hemorrhoids.  Hard stools are like sandpaper to the anorectal canal and will cause more problems.  The goal: ONE SOFT BOWEL MOVEMENT A DAY!  BMs from every other day to 3 times a day is a tolerable range °Treat coughing, diarrhea and constipation early since irritated hemorrhoids may soon follow.  °If your main job activity is seated, always stand or walk during your breaks. Make it a point to stand and walk at least 5 minutes every hour and try to shift frequently in your chair to avoid direct rectal pressure.  °Always exhale as you strain or lift. Don't hold your  breath.  °Do not delay or try to prevent a bowel movement when the urge is present. °Exercise regularly (walking or jogging 60 minutes a day) to stimulate the bowels to move. °No reading or other activity while on the toilet. If bowel movements take longer than 5 minutes, you are too constipated. °AVOID CONSTIPATION °Drink plenty of liquids (1 1/2 to 2 quarts of water and other fluids a day unless fluid restricted for another medical condition). Liquids that contain caffeine (coffee a, tea, soft drinks) can be dehydrating and should be avoided until constipation is controlled. Consider minimizing milk, as dairy products may be constipating. °Eat plenty of fiber (30g a day ideal, more if needed).  Fiber is the undigested part of plant food that passes into the colon, acting as “natures broom” to encourage bowel motility and movement.  Fiber can absorb and hold large amounts of water. This results in a larger, bulkier stool, which is soft and easier to pass.  °Eating foods high in fiber - 12 servings - such as  °Vegetables: Root (potatoes, carrots, turnips), Leafy green (lettuce, salad greens, celery, spinach), High residue (cabbage, broccoli, etc.) °Fruit: Fresh, Dried (prunes, apricots, cherries), Stewed (applesauce)  °Whole grain breads, pasta, whole wheat °Bran cereals, muffins, etc. °Consider adding supplemental bulking fiber which retains large volumes of water: °Psyllium ground seeds (native plant from [place])--available as Metamucil, Konsyl, Effersyllium, Per Diem Fiber, or the less expensive generic forms.  °Citrucel  (methylcellulose wood fiber) . °FiberCon (Polycarbophil) °Polyethylene Glycol - and “artificial” fiber commonly called Miralax or Glycolax.  It is helpful for people with gassy or bloated feelings with regular fiber °Flax Seed - a less gassy natural fiber  °Laxatives can be useful for a short period if constipation is severe °Osmotics (Milk of Magnesia, Fleets Phospho-Soda, Magnesium  Citrate)  °Stimulants (Senokot,   Castor Oil,  Dulcolax, Ex-Lax)    °Laxatives are not a good long-term solution as it can stress the bowels and cause too much mineral loss and dehydration.     Avoid taking laxatives for more than 7 days in a row. ° °AVOID DIARRHEA °Switch to liquids and simpler foods for a few days to avoid stressing your intestines further. °Avoid dairy products (especially milk & ice cream) for a short time.  The intestines often can lose the ability to digest lactose when stressed. °Avoid foods that cause gassiness or bloating.  Typical foods include beans and other legumes, cabbage, broccoli, and dairy foods.  Every person has some sensitivity to other foods, so listen to your body and avoid those foods that trigger problems for you. °Adding fiber (Citrucel, Metamucil, FiberCon, Flax seed, Miralax) gradually can help thicken stools by absorbing excess fluid and retrain the intestines to act more normally.  Slowly increase the dose over a few weeks.  Too much fiber too soon can backfire and cause cramping & bloating. °Probiotics (such as active yogurt, Align, etc) may help repopulate the intestines and colon with normal bacteria and calm down a sensitive digestive tract.  Most studies show it to be of mild help, though, and such products can be costly. °Medicines: °Bismuth subsalicylate (ex. Kayopectate, Pepto Bismol) every 30 minutes for up to 6 doses can help control diarrhea.  Avoid if pregnant. °Loperamide (Immodium) can slow down diarrhea.  Start with two tablets (4mg total) first and then try one tablet every 6 hours.  Avoid if you are having fevers or severe pain.  If you are not better or start feeling worse, stop all medicines and call your doctor for advice °Call your doctor if you are getting worse or not better.  Sometimes further testing (cultures, endoscopy, X-ray studies, bloodwork, etc) may be needed to help diagnose and treat the cause of the diarrhea. ° °TROUBLESHOOTING IRREGULAR  BOWELS °1) Avoid extremes of bowel movements (no bad constipation/diarrhea) °2) Miralax 17gm mixed in 8oz. water or juice-daily. May use BID as needed.  °3) Gas-x,Phazyme, etc. as needed for gas & bloating.  °4) Soft,bland diet. No spicy,greasy,fried foods.  °5) Prilosec over-the-counter as needed  °6) May hold gluten/wheat products from diet to see if symptoms improve.  °7)  May try probiotics (Align, Activa, etc) to help calm the bowels down °7) If symptoms become worse call back immediately. ° ° °TREATMENT OF HEMORRHOID FLARE °If these preventive measures fail, you must take action right away! Hemorrhoids are one condition that can be mild in the morning and become intolerable by nightfall. °Most hemorrhoidal flares take several weeks to calm down.  These suggestions can help: °Warm soaks.  This helps more than any topical medication.  Use up to 8 times a day.  Usually sitz baths or sitting in a warm bathtub helps.  Sitting on moist warm towels are helpful.  Switching to ice packs/cool compresses can be helpful ° °Use a Sitz Bath 4-8 times a day for relief °A sitz bath is a warm water bath taken in the sitting position that covers only the hips and buttocks. It may be used for either healing or hygiene purposes. Sitz baths are also used to relieve pain, itching, or muscle spasms. The water may contain medicine. Moist heat will help you heal and relax.  °HOME CARE INSTRUCTIONS  °Take 3 to 4 sitz baths a day. °6. Fill the bathtub half full with warm water. °7. Sit in the water and open the drain a little. °8. Turn on the warm water to keep the tub half full. Keep the water running constantly. °9. Soak in the water for 15 to 20 minutes. °  10. After the sitz bath, pat the affected area dry first. °SEEK MEDICAL CARE IF:  °You get worse instead of better. Stop the sitz baths if you get worse. ° °Normalize your bowels.  Extremes of diarrhea or constipation will make hemorrhoids worse.  One soft bowel movement a day is  the goal.  Fiber can help get your bowels regular °Wet wipes instead of toilet paper °Pain control with a NSAID such as ibuprofen (Advil) or naproxen (Aleve) or acetaminophen (Tylenol) around the clock.  Narcotics are constipating and should be minimized if possible °Topical creams contain steroids (bydrocortisone) or local anesthetic (xylocaine) can help make pain and itching more tolerable.   °EVALUATION °If hemorrhoids are still causing problems, you could benefit by an evaluation by a surgeon.  The surgeon will obtain a history and examine you.  If hemorrhoids are diagnosed, some therapies can be offered in the office, usually with an anoscope into the less sensitive area of the rectum: °-injection of hemorrhoids (sclerotherapy) can scar the blood vessels of the swollen/enlarged hemorrhoids to help shrink them down to a more normal size °-rubber banding of the enlarged hemorrhoids to help shrink them down to a more normal size °-drainage of the blood clot causing a thrombosed hemorrhoid,  to relieve the severe pain  ° °While 90% of the time such problems from hemorrhoids can be managed without preceding to surgery, sometimes the hemorrhoids require a operation to control the problem (uncontrolled bleeding, prolapse, pain, etc.).   This involves being placed under general anesthesia where the surgeon can confirm the diagnosis and remove, suture, or staple the hemorrhoid(s).  Your surgeon can help you treat the problem appropriately.   ° °

## 2017-04-16 NOTE — Op Note (Addendum)
04/16/2017  1:06 PM  PATIENT:  [name]  42 y.o. female  Patient Care Team: Elizabeth Palau, FNP as PCP - General (Nurse Practitioner) Karie Soda, MD as Consulting Physician (General Surgery) Marlow Baars, MD as Consulting Physician (Obstetrics)  PRE-OPERATIVE DIAGNOSIS:  Symptomatic external hemorrhoids  POST-OPERATIVE DIAGNOSIS:  Symptomatic prolapsing internal (Grade3) as well as external hemorrhoids  PROCEDURE:    External hemorrhoidectomy  x1 Internal hemorrhoidal ligation and pexy x 6  Anorectal examination under anesthesia  SURGEON:  Ardeth Sportsman, MD  ANESTHESIA:   General Anorectal & Local field block  0.25% bupivacaine with epinephrine at the beginning of the case. Liposomal bupivacaine (Experel) at the end of the case.  EBL:  Total I/O In: -  Out: 25 [Blood:25].  See operative record  Delay start of Pharmacological VTE agent (>24hrs) due to surgical blood loss or risk of bleeding:  NO  DRAINS: NONE  SPECIMEN:   External hemorrhoid x1  DISPOSITION OF SPECIMEN:  PATHOLOGY  COUNTS:  YES  PLAN OF CARE: Discharge home after PACU  PATIENT DISPOSITION:  PACU - hemodynamically stable.  INDICATION: Pleasant patient with struggles with hemorrhoids.  Not able to be managed in the office despite an improved bowel regimen.  I recommended examination under anesthesia and surgical treatment:  The anatomy & physiology of the anorectal region was discussed.  The pathophysiology of hemorrhoids and differential diagnosis was discussed.  Natural history risks without surgery was discussed.   I stressed the importance of a bowel regimen to have daily soft bowel movements to minimize progression of disease.  Interventions such as sclerotherapy & banding were discussed.  The patient's symptoms are not adequately controlled by medicines and other non-operative treatments.  I feel the risks & problems of no surgery outweigh the operative risks; therefore, I  recommended surgery to treat the hemorrhoids by ligation, pexy, and possible resection.  Risks such as bleeding, infection, need for further treatment, heart attack, death, and other risks were discussed.   I noted a good likelihood this will help address the problem.  Goals of post-operative recovery were discussed as well.  Possibility that this will not correct all symptoms was explained.  Post-operative pain, bleeding, constipation, urinary difficulties, and other problems after surgery were discussed.  We will work to minimize complications.   Educational handouts further explaining the pathology, treatment options, and bowel regimen were given as well.  Questions were answered.  The patient expresses understanding & wishes to proceed with surgery.  OR FINDINGS: Grade 2 and 3 internal hemorrhoids.  Right anterior prolapsing but able to be ligated.   Standard ligation and pexy x 6 anorectal columns. Persistent left anterior external hemorrhoidal tissue excised  DESCRIPTION:   Informed consent was confirmed. Patient underwent general anesthesia without difficulty. Patient was placed into prone positioning.  The perianal region was prepped and draped in sterile fashion. Surgical time-out confirmed our plan.  I did digital rectal examination and then transitioned over to anoscopy to get a sense of the anatomy.  Findings noted above.  Circumferentially dilated internal hemorrhoids.  Mostly grade 2 but right anterior piles grade 3.  Left-sided external hemorrhoidal tissue.  I proceeded to do hemorrhoidal ligation and pexy.  I used a 2-0 Vicryl suture on a UR-6 needle in a figure-of-eight fashion 6 cm proximal to the anal verge.  I started at the largest hemorrhoid pile.  Because of redundant hemorrhoidal tissue too bulky to merely ligate or pexy, I excised the excess internal hemorrhoid piles  longitudinally in a fusiform biconcave fashion, at the left anterior location, sparing the anal canal to avoid  narrowing.  I then ran that stitch longitudinally more distally to close the hemorrhoidectomy wound to the anal verge over a Parks self retaining retractor & occasionally a large Hill-Furgeson retractor to avoid narrowing of the anal canal.  I then tied that stitch down to cause a hemorrhoidopexy.   I then did hemorrhoidal ligation and pexy at the other 5 columns.  At the completion of this, all 6 anorectal columns were ligated and pexied in the classic hexagonal fashion (right anterior/lateral/posterior, left anterior/lateral/posterior).  I closed the external part of the left anterior hemorrhoidectomy wound with interrupted horizontal mattress 2-0 chromic suture, leaving the last 5 mm open to allow natural drainage.    I redid anoscopy & examination.  At completion of this, all hemorrhoids had been removed or reduced into the rectum.  There is no more prolapse.  Internal & external anatomy was more more normal.  Hemostasis was good.  Fluffed gauze was on-laid over the perianal region.  No packing done.  Patient is being extubated go to go to the recovery room.  I had discussed postop care in detail with the patient & her significant other in the preop holding area.  Instructions for post-operative recovery were discussed, and prescriptions are written. I made an attempt to locate family to discuss patient's status and recommendations.  No one is available at this time.  I will try again later.   Ardeth SportsmanSteven C. Justice Milliron, M.D., F.A.C.S. Gastrointestinal and Minimally Invasive Surgery [facility] [address] [place], [place] [address] [phone] Main / Paging

## 2017-04-16 NOTE — Anesthesia Preprocedure Evaluation (Signed)
Anesthesia Evaluation  Patient identified by MRN, date of birth, ID band Patient awake    Reviewed: Allergy & Precautions, NPO status , Patient's Chart, lab work & pertinent test results  Airway Mallampati: I  TM Distance: >3 FB Neck ROM: Full    Dental  (+) Teeth Intact, Dental Advisory Given   Pulmonary former smoker,    breath sounds clear to auscultation       Cardiovascular negative cardio ROS   Rhythm:Regular Rate:Normal     Neuro/Psych  Headaches, PSYCHIATRIC DISORDERS Anxiety Depression    GI/Hepatic Neg liver ROS, GERD  ,  Endo/Other  negative endocrine ROS  Renal/GU negative Renal ROS  negative genitourinary   Musculoskeletal negative musculoskeletal ROS (+)   Abdominal Normal abdominal exam  (+)   Peds negative pediatric ROS (+)  Hematology negative hematology ROS (+)   Anesthesia Other Findings   Reproductive/Obstetrics negative OB ROS                             Lab Results  Component Value Date   WBC 5.8 04/10/2017   HGB 12.9 04/10/2017   HCT 39.1 04/10/2017   MCV 91.1 04/10/2017   PLT 300 04/10/2017   Lab Results  Component Value Date   CREATININE 0.76 10/10/2015   BUN 14 10/10/2015   NA 136 10/10/2015   K 3.7 10/10/2015   CL 102 10/10/2015   CO2 26 10/10/2015   No results found for: INR, PROTIME  Anesthesia Physical  Anesthesia Plan  ASA: II and emergent  Anesthesia Plan: General   Post-op Pain Management:    Induction: Intravenous  PONV Risk Score and Plan: 3 and Ondansetron, Dexamethasone and Midazolam  Airway Management Planned: Oral ETT  Additional Equipment:   Intra-op Plan:   Post-operative Plan: Extubation in OR  Informed Consent: I have reviewed the patients History and Physical, chart, labs and discussed the procedure including the risks, benefits and alternatives for the proposed anesthesia with the patient or authorized  representative who has indicated his/her understanding and acceptance.   Dental advisory given  Plan Discussed with: CRNA  Anesthesia Plan Comments:         Anesthesia Quick Evaluation

## 2017-04-16 NOTE — Transfer of Care (Signed)
Immediate Anesthesia Transfer of Care Note  Patient: Mary FusJennifer J [name]  Procedure(s) Performed: ANORECTAL EXAM UNDER ANESTHESIA WITH GRADE 2 INTERNAL LIGATION PEXY X3, HEMORRHOIDECTOMY (N/A Rectum)  Patient Location: PACU  Anesthesia Type:General  Level of Consciousness: awake, alert  and oriented  Airway & Oxygen Therapy: Patient Spontanous Breathing and Patient connected to face mask oxygen  Post-op Assessment: Report given to RN and Post -op Vital signs reviewed and stable  Post vital signs: Reviewed and stable  Last Vitals:  Vitals:   04/16/17 0956  BP: (!) 138/94  Pulse: 84  Resp: 16  Temp: 36.7 C  SpO2: 100%    Last Pain:  Vitals:   04/16/17 0956  TempSrc: Oral         Complications: No apparent anesthesia complications

## 2017-04-16 NOTE — Anesthesia Procedure Notes (Signed)
Procedure Name: Intubation Date/Time: 04/16/2017 12:11 PM Performed by: Maxwell Caul, CRNA Pre-anesthesia Checklist: Patient identified, Emergency Drugs available, Suction available and Patient being monitored Patient Re-evaluated:Patient Re-evaluated prior to induction Oxygen Delivery Method: Circle system utilized Preoxygenation: Pre-oxygenation with 100% oxygen Induction Type: IV induction Ventilation: Mask ventilation without difficulty Laryngoscope Size: Mac and 4 Grade View: Grade I Tube type: Oral Tube size: 7.5 mm Number of attempts: 1 Airway Equipment and Method: Stylet Placement Confirmation: ETT inserted through vocal cords under direct vision,  positive ETCO2 and breath sounds checked- equal and bilateral Secured at: 21 cm Tube secured with: Tape Dental Injury: Teeth and Oropharynx as per pre-operative assessment

## 2017-04-16 NOTE — Interval H&P Note (Signed)
History and Physical Interval Note:  04/16/2017 11:38 AM  [name]  has presented today for surgery, with the diagnosis of Symptomatic external hemorrhoids  The various methods of treatment have been discussed with the patient and family. After consideration of risks, benefits and other options for treatment, the patient has consented to  Procedure(s): ANORECTAL EXAM UNDER ANESTHESIA WITH HEMORRHOIDECTOMY (N/A) as a surgical intervention .  The patient's history has been reviewed, patient examined, no change in status, stable for surgery.  I have reviewed the patient's chart and labs.  Questions were answered to the patient's satisfaction.     Ardeth SportsmanSteven C Chai Routh

## 2017-04-16 NOTE — H&P (Signed)
[name]  Patient #: [id number] DOB: 04-Oct-1975 Single / Language: [place] / Race: White Female  Patient Care Team: Elizabeth Palau, FNP as PCP - General (Nurse Practitioner) Karie Soda, MD as Consulting Physician (General Surgery) Marlow Baars, MD as Consulting Physician (Obstetrics)    History of Present Illness  The patient is a 42 year old female who presents with hemorrhoids.   Patient with history of internal prolapsing hemorrhoids underwent excision 2017.  Developed residual external hemorrhoidal skin tags.  Initially not bothersome but have been progressively annoying over the past 2 years despite treatments OTC.  Wish to have them excised.  Comes in for removal.. Had some rectal bleeding concerning for hemorrhoid flare starting a month ago. Topical over-the-counter medication not really helping. Perhaps even irritating more.. She is at her baseline of 2-3 bowel movements a day. Not on anything to regulate her bowels. Usually they are soft and well-formed. No bad bouts of constipation or diarrhea. Occasional irritation but not severe dyschezia.    03/23/2015  10:37 AM  PATIENT: [name] 42 y.o. female  Patient Care Team: Elizabeth Palau, FNP as PCP - General (Nurse Practitioner) Karie Soda, MD as Consulting Physician (General Surgery) Marlow Baars, MD as Consulting Physician (Obstetrics)  PRE-OPERATIVE DIAGNOSIS:   Grade 2/3 internal hemorrhoids with pain and bleeding External hemorrhoids with itching, pain, and bleeding  POST-OPERATIVE DIAGNOSIS:   Grade 2/3 internal hemorrhoids with pain and bleeding External hemorrhoids with itching, pain, and bleeding  PROCEDURE:  EXAM UNDER ANESTHESIA LIGATION AND PEXY INTERNAL HEMORRHOIDS EXCISION SYMPTOMATIC EXTERNAL HEMORRHOIDS  SURGEON: Surgeon(s): Karie Soda, MD  ASSISTANT: none  ANESTHESIA:  Local field block Anorectal block General  0.25% bupivacaine with epinephrine  at the beginning of the case.  Liposomal bupivacaine (Experel) at the end of the case.  EBL: Total I/O In: 200 [I.V.:200] Out: -  Delay start of Pharmacological VTE agent (>24hrs) due to surgical blood loss or risk of bleeding: no  DRAINS: none  SPECIMEN: Source of Specimen: External hemorrhoids x 2 (left lateral & right lateral)  DISPOSITION OF SPECIMEN: PATHOLOGY  COUNTS: YES  PLAN OF CARE: Discharge to home after PACU  PATIENT DISPOSITION: PACU - hemodynamically stable.  INDICATION: Pleasant woman sent by her gynecologist for concern of persistent hemorrhoids. Has intermittent episodes of flare bleeding and prolapse. Exhausted nonoperative management. Main issues external piles. I offered surgical ligation and possible hemorrhoidectomy.  The anatomy & physiology of the anorectal region was discussed. The pathophysiology of hemorrhoids and differential diagnosis was discussed. Natural history risks without surgery was discussed. I stressed the importance of a bowel regimen to have daily soft bowel movements to minimize progression of disease. Interventions such as sclerotherapy & banding were discussed.  The patient's symptoms are not adequately controlled by medicines and other non-operative treatments. I feel the risks & problems of no surgery outweigh the operative risks; therefore, I recommended surgery to treat the hemorrhoids by ligation, pexy, and possible resection.  Risks such as bleeding, infection, urinary difficulties, need for further treatment, heart attack, death, and other risks were discussed. I noted a good likelihood this will help address the problem. Goals of post-operative recovery were discussed as well. Possibility that this will not correct all symptoms was explained. Post-operative pain, bleeding, constipation, and other problems after surgery were discussed. We will work to minimize complications. Educational handouts further  explaining the pathology, treatment options, and bowel regimen were given as well. Questions were answered. The patient expresses understanding & wishes to proceed  with surgery.   OR FINDINGS: Right posterior greater than right anterior and left lateral grade 3/2 internal hemorrhoids. External hemorrhoids left lateral & right lateral aspects.  No evidence of fissure, fistula, condyloma, tumor. Normal sphincter tone.  DESCRIPTION:  Informed consent was confirmed. Patient underwent general anesthesia without difficulty. Patient was placed into prone positioning. The perianal region was prepped and draped in sterile fashion. Surgical time-out confirmed our plan.  I did digital rectal examination and then transitioned over to anoscopy to get a sense of the anatomy. I proceeded to ligate the hemorrhoidal arteries. I used a 2-0 Vicryl suture on a UR-6 needle in a figure-of-eight fashion over the signal around 6 cm proximal to the anal verge. I then ran that stitch longitudinally more distally to the white line of [place]. I then tied that stitch down to cause a hemorrhoidopexy. I did that in at hexagonal pattern (right anterior/lateral/posterior, left anterior/lateral/posterior) for all 6 locations .   At completion of this, most of the hemorrhoids were reduced into the rectum. There was no more prolapse. External anatomy did still left have lateral and right lateral external hemorrhoids. I excised those longitudinally in a biconcave fusiform fashion. I closed those wounds vertically using running 3-0 chromic suture, leaving the distal 5 mm open for drainage.  I repeated anoscopy and examination. There is no stricturing. There is no bleeding. There is no prolapse. Hemostasis was good. Patient is being extubated go to recovery room.  I had discussed postop care in detail with the patient in the preop holding area. Instructions are written. I am about to discuss the patient's status to husband  as well.   Ardeth Sportsman, M.D., F.A.C.S. Gastrointestinal and Minimally Invasive Surgery [facility] [address] [place], [place] [address] [phone] Main / Paging ` ` ` Diagnosis Hemorrhoids, external CONSISTENT WITH HEMORRHOIDS NEGATIVE FOR MALIGNANCY Marlena Clipper MD Pathologist, Electronic Signature (Case signed 03/26/2015) Specimen Emanual Lamountain and Clinical Information Specimen(s) Obtained: Hemorrhoids, external Specimen Clinical Information grade 2/3 internal hemorrhoids with pain and bleeding. External hemorrhoids with itching pain and bleeding [rd] Nathanyl Andujo The specimen is received in formalin and consists of two pieces of tan-pink to gray, wrinkled skin and soft tissue measuring 0.9 x 0.6 x 0.5 cm and 1.5 x 0.9 x 0.5 cm. Sectioning reveals a tan, hemorrhagic cut surface. Representative sections are submitted in one cassette. 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 Oral Surgery   Diagnostic Studies History Ardeth Sportsman, MD; 11/29/2016 10:46 AM) Colonoscopy  never Mammogram  never Pap Smear  1-5 years ago  Allergies Christianne Dolin, RMA; 11/29/2016 10:35 AM) Levaquin *Fluoroquinolones**   Medication History Christianne Dolin, RMA; 11/29/2016 10:35 AM) Sprintec 28 (0.25-35MG -MCG Tablet, Oral) Active. Lexapro (5MG  Tablet, Oral) Active. TiZANidine HCl (4MG  Tablet, Oral) Active. SUMAtriptan Succinate (100MG  Tablet, Oral) Active. Promethazine HCl (25MG  Tablet, Oral) Active. Medications Reconciled  Social History Ardeth Sportsman, MD; 11/29/2016 10:46 AM) Alcohol use  Occasional  alcohol use. Caffeine use  Carbonated beverages, Coffee, Tea. No drug use  Tobacco use  Former smoker.  Family History Ardeth Sportsman, MD; 11/29/2016 10:46 AM) Depression  Sister. Hypertension  Mother. Migraine Headache  Father.  Pregnancy / Birth History (  Ardeth SportsmanSteven C. Dawanna Grauberger, MD; 11/29/2016 10:46 AM) Age at menarche  15 years. Contraceptive History  Oral contraceptives. Gravida  4 Maternal age  40-20 Para  3 Regular periods   Other Problems Ardeth Sportsman(Zamar Odwyer C. Arliss Hepburn, MD; 11/29/2016 10:46 AM) Anxiety Disorder  Depression  Hemorrhoids  Migraine Headache   Vitals Christianne Dolin(Christen Lambert RMA; 11/29/2016 10:36 AM) 11/29/2016 10:36 AM Weight: 145.6 lb Height: 63in Body Surface Area: 1.69 m Body Mass Index: 25.79 kg/m  Temp.: 98.24F  Pulse: 94 (Regular)  BP: 110/80 (Sitting, Left Arm, Standard)   BP (!) 138/94 (BP Location: Right Arm)   Pulse 84   Temp 98.1 F (36.7 C) (Oral)   Resp 16   Ht 5\' 3"  (1.6 m)   Wt 64 kg (141 lb)   LMP 04/09/2017 (Exact Date)   SpO2 100%   BMI 24.98 kg/m      Physical Exam  General Mental Status-Alert. General Appearance-Not in acute distress. Voice-Normal.  Integumentary Global Assessment Normal Exam - Distribution of scalp and body hair is normal. General Characteristics Overall Skin Surface - no rashes and no suspicious lesions.  Head and Neck Head-normocephalic, atraumatic with no lesions or palpable masses. Face Global Assessment - atraumatic, no absence of expression. Neck Global Assessment - no abnormal movements, no decreased range of motion. Trachea-midline. Thyroid Gland Characteristics - non-tender.  Eye Eyeball - Left-Extraocular movements intact, No Nystagmus. Eyeball - Right-Extraocular movements intact, No Nystagmus. Upper Eyelid - Left-No Cyanotic. Upper Eyelid - Right-No Cyanotic.  Chest and Lung Exam Inspection Accessory muscles - No use of accessory muscles in  breathing.  Abdomen Note: Abdomen soft. Nontender, nondistended. No guarding. No diastasis. No umbilical nor other hernias   Female Genitourinary Note: Normal external female genitalia. No vaginal bleeding or discharge.   Rectal Note: Left lateral external skin tag. Right anterior as well. 5 mm at the most.  Perianal skin clear. Grade 1 internal hemorrhoids right anterior left lateral. Maybe grade 2 right posterior. No active bleeding. Mild 5 mm scarring on left lateral internal hemorrhoid. No abscess. No fissure. No fistula. No condyloma.   Peripheral Vascular Upper Extremity Inspection - Left - Not Gangrenous, No Petechiae. Right - Not Gangrenous, No Petechiae.  Neurologic Neurologic evaluation reveals -normal attention span and ability to concentrate, able to name objects and repeat phrases. Appropriate fund of knowledge and normal coordination.  Neuropsychiatric Mental status exam performed with findings of-able to articulate well with normal speech/language, rate, volume and coherence and no evidence of hallucinations, delusions, obsessions or homicidal/suicidal ideation. Orientation-oriented X3.  Musculoskeletal Global Assessment Gait and Station - normal gait and station.  Lymphatic General Lymphatics Description - No Generalized lymphadenopathy.   Results Ardeth Sportsman(Duanne Duchesne C. Alfred Eckley MD; 11/29/2016 11:11 AM) Procedures  Name Value Date Hemorrhoids Procedure Anal exam: External Hemorrhoid Internal exam: Internal Hemorroids ( non-bleeding) Other: Left lateral external skin tag. Right anterior as well. 5 mm at the most. Perianal skin clear. Grade 1 internal hemorrhoids right anterior left lateral. Maybe grade 2 right posterior. No active bleeding. Mild 5 mm scarring on left lateral internal hemorrhoid. No abscess. No fissure. No fistula. No condyloma.  Performed: 11/29/2016 11:09 AM    Assessment & Plan  EXTERNAL HEMORRHOIDS WITH  COMPLICATION (K64.4) Impression: Persistently irritated and occasionally bleeding external hemorrhoid fold/tags.  Refractory to topical therapy and bowel regimen.  Persistent pruritus and other issues refractory to topical and other modifications.  She wishes excision.  I recommended outpatient surgery.  The anatomy & physiology of the anorectal region was discussed.  The pathophysiology of hemorrhoids and differential diagnosis was discussed.  Natural history risks without surgery was discussed.   I stressed the importance of a bowel regimen to have daily soft bowel movements to minimize progression of disease.  Interventions such as sclerotherapy & banding were discussed.  The patient's symptoms are not adequately controlled by medicines and other non-operative treatments.  I feel the risks & problems of no surgery outweigh the operative risks; therefore, I recommended surgery to treat the hemorrhoids by ligation, pexy, and possible resection.  Risks such as bleeding, infection, urinary difficulties, injury to other organs, need for repair of tissues / organs, need for further treatment, heart attack, death, and other risks were discussed.   I noted a good likelihood this will help address the problem.  Goals of post-operative recovery were discussed as well.  Possibility that this will not correct all symptoms was explained.  Post-operative pain, bleeding, constipation, and other problems after surgery were discussed.  We will work to minimize complications.   Educational handouts further explaining the pathology, treatment options, and bowel regimen were given as well.  Questions were answered.  The patient expresses understanding & wishes to proceed with surgery.        Ardeth Sportsman, M.D., F.A.C.S. Gastrointestinal and Minimally Invasive Surgery [facility] [address] [place], [place] [address] [phone] Main / Paging

## 2017-04-16 NOTE — Anesthesia Postprocedure Evaluation (Signed)
Anesthesia Post Note  Patient: Orvilla FusJennifer J Fuerte  Procedure(s) Performed: ANORECTAL EXAM UNDER ANESTHESIA WITH GRADE 2 INTERNAL LIGATION PEXY X3, HEMORRHOIDECTOMY (N/A Rectum)     Patient location during evaluation: PACU Anesthesia Type: General Level of consciousness: awake and alert Pain management: pain level controlled Vital Signs Assessment: post-procedure vital signs reviewed and stable Respiratory status: spontaneous breathing, nonlabored ventilation and respiratory function stable Cardiovascular status: blood pressure returned to baseline and stable Postop Assessment: no apparent nausea or vomiting Anesthetic complications: no    Last Vitals:  Vitals:   04/16/17 1350 04/16/17 1424  BP: (!) 154/98 (!) 143/95  Pulse: 89 81  Resp: 16   Temp:    SpO2: 100% 94%    Last Pain:  Vitals:   04/16/17 0956  TempSrc: Oral                 Lowella CurbWarren Ray Kateena Degroote

## 2017-04-17 ENCOUNTER — Encounter (HOSPITAL_COMMUNITY): Payer: Self-pay | Admitting: Surgery

## 2017-07-26 ENCOUNTER — Telehealth: Payer: Self-pay | Admitting: Neurology

## 2017-07-28 MED ORDER — SUMATRIPTAN SUCCINATE 100 MG PO TABS: ORAL_TABLET | ORAL | 1 refills | Status: DC

## 2017-07-28 NOTE — Telephone Encounter (Signed)
Imitrex r/f as requested/fim

## 2017-07-28 NOTE — Addendum Note (Signed)
Addended by: Candis SchatzMISENHEIMER, Ernesha Ramone I on: 07/28/2017 09:47 AM   Modules accepted: Orders

## 2017-07-28 NOTE — Telephone Encounter (Signed)
Pt schedule appt with Dr. Epimenio FootSater on 7/30 requesting refill for SUMAtriptan (IMITREX) 100 MG tablet [facility] to [facility] [address]

## 2017-09-10 ENCOUNTER — Other Ambulatory Visit: Payer: Self-pay

## 2017-09-10 ENCOUNTER — Emergency Department (HOSPITAL_COMMUNITY)
Admission: EM | Admit: 2017-09-10 | Discharge: 2017-09-10 | Disposition: A | Discharge status: Home / Self Care | Payer: Medicaid Other | Attending: Emergency Medicine | Admitting: Emergency Medicine

## 2017-09-10 ENCOUNTER — Encounter (HOSPITAL_COMMUNITY): Payer: Self-pay

## 2017-09-10 DIAGNOSIS — Z79899 Other long term (current) drug therapy: Secondary | ICD-10-CM | POA: Insufficient documentation

## 2017-09-10 DIAGNOSIS — Z113 Encounter for screening for infections with a predominantly sexual mode of transmission: Secondary | ICD-10-CM | POA: Diagnosis not present

## 2017-09-10 DIAGNOSIS — R070 Pain in throat: Secondary | ICD-10-CM | POA: Insufficient documentation

## 2017-09-10 DIAGNOSIS — Z87891 Personal history of nicotine dependence: Secondary | ICD-10-CM | POA: Insufficient documentation

## 2017-09-10 DIAGNOSIS — J029 Acute pharyngitis, unspecified: Secondary | ICD-10-CM

## 2017-09-10 LAB — MONONUCLEOSIS SCREEN: MONO SCREEN: NEGATIVE

## 2017-09-10 MED ORDER — DEXAMETHASONE 4 MG PO TABS
10.0000 mg | ORAL_TABLET | Freq: Once | ORAL | Status: AC
  Administered 2017-09-10: 10 mg via ORAL
  Filled 2017-09-10: qty 2

## 2017-09-10 NOTE — ED Notes (Signed)
Pt reports that has been on a Z pack and was tested for Strep and was negative. Pt reports that she still  Sore throat HA low grade fever.. Pt was told to be checked for Mono by PCP.

## 2017-09-10 NOTE — ED Triage Notes (Signed)
Pt presents to ED from home for sore throat since last Wednesday. Pt reports that she went to urgent care on Saturday and started z-pack for possible bacterial infection. Pt reports strep swab came back negative. PCP wanted pt to be checked for mono.

## 2017-09-10 NOTE — Discharge Instructions (Signed)
You can alternate ibuprofen and Tylenol as prescribed over-the-counter, as needed for your pain.  You will be called with any positive test results.  You can also follow these results on MyChart online.  Please return the emergency department if you develop any new or worsening symptoms.

## 2017-09-11 LAB — GC/CHLAMYDIA PROBE AMP (~~LOC~~) NOT AT ARMC
Chlamydia: NEGATIVE
Neisseria Gonorrhea: NEGATIVE
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
## 2017-09-14 LAB — HSV CULTURE AND TYPING

## 2017-09-16 ENCOUNTER — Telehealth (HOSPITAL_BASED_OUTPATIENT_CLINIC_OR_DEPARTMENT_OTHER): Payer: Self-pay | Admitting: Emergency Medicine

## 2017-09-16 NOTE — Telephone Encounter (Signed)
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  I called [facility]alt[facility] and [facility].  Patient has an appointment with her PCP next week to discuss this diagnosis, however questions were answered over the phone as well.

## 2017-09-23 ENCOUNTER — Ambulatory Visit: Payer: Self-pay | Admitting: Neurology

## 2017-09-27 ENCOUNTER — Encounter (HOSPITAL_BASED_OUTPATIENT_CLINIC_OR_DEPARTMENT_OTHER): Payer: Self-pay | Admitting: Emergency Medicine

## 2017-09-27 ENCOUNTER — Emergency Department (HOSPITAL_BASED_OUTPATIENT_CLINIC_OR_DEPARTMENT_OTHER): Payer: Medicaid Other

## 2017-09-27 ENCOUNTER — Other Ambulatory Visit: Payer: Self-pay

## 2017-09-27 ENCOUNTER — Emergency Department (HOSPITAL_BASED_OUTPATIENT_CLINIC_OR_DEPARTMENT_OTHER)
Admission: EM | Admit: 2017-09-27 | Discharge: 2017-09-27 | Disposition: A | Discharge status: Home / Self Care | Payer: Medicaid Other | Attending: Emergency Medicine | Admitting: Emergency Medicine

## 2017-09-27 DIAGNOSIS — Z79899 Other long term (current) drug therapy: Secondary | ICD-10-CM | POA: Diagnosis not present

## 2017-09-27 DIAGNOSIS — Z87891 Personal history of nicotine dependence: Secondary | ICD-10-CM | POA: Diagnosis not present

## 2017-09-27 DIAGNOSIS — R079 Chest pain, unspecified: Secondary | ICD-10-CM

## 2017-09-27 DIAGNOSIS — R0789 Other chest pain: Secondary | ICD-10-CM | POA: Insufficient documentation

## 2017-09-27 LAB — CBC
HCT: 37.2 % (ref 36.0–46.0)
Hemoglobin: 12.5 g/dL (ref 12.0–15.0)
MCH: 30.6 pg (ref 26.0–34.0)
MCHC: 33.6 g/dL (ref 30.0–36.0)
MCV: 91 fL (ref 78.0–100.0)
PLATELETS: 329 10*3/uL (ref 150–400)
RBC: 4.09 MIL/uL (ref 3.87–5.11)
RDW: 13.2 % (ref 11.5–15.5)
WBC: 7.1 10*3/uL (ref 4.0–10.5)

## 2017-09-27 LAB — TROPONIN I: Troponin I: <0.03 ng/mL (ref <0.03)

## 2017-09-27 LAB — BASIC METABOLIC PANEL
ANION GAP: 9 (ref 5–15)
BUN: 15 mg/dL (ref 6–20)
CALCIUM: 8.6 mg/dL — AB (ref 8.9–10.3)
CO2: 25 mmol/L (ref 22–32)
CREATININE: 0.78 mg/dL (ref 0.44–1.00)
Chloride: 104 mmol/L (ref 98–111)
Glucose, Bld: 117 mg/dL — ABNORMAL HIGH (ref 70–99)
Potassium: 4.2 mmol/L (ref 3.5–5.1)
Sodium: 138 mmol/L (ref 135–145)

## 2017-09-27 NOTE — ED Triage Notes (Signed)
Pt c/o left upper chest pain radiating to back intermittent x 2 days. Denies any other symptoms.
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
## 2017-12-16 ENCOUNTER — Other Ambulatory Visit: Payer: Self-pay | Admitting: Obstetrics and Gynecology

## 2017-12-16 DIAGNOSIS — Z1231 Encounter for screening mammogram for malignant neoplasm of breast: Secondary | ICD-10-CM

## 2017-12-22 ENCOUNTER — Other Ambulatory Visit (HOSPITAL_BASED_OUTPATIENT_CLINIC_OR_DEPARTMENT_OTHER): Payer: Self-pay | Admitting: Nurse Practitioner

## 2017-12-22 DIAGNOSIS — Z1231 Encounter for screening mammogram for malignant neoplasm of breast: Secondary | ICD-10-CM

## 2017-12-23 ENCOUNTER — Encounter (HOSPITAL_BASED_OUTPATIENT_CLINIC_OR_DEPARTMENT_OTHER): Payer: Medicaid Other

## 2017-12-23 ENCOUNTER — Encounter (HOSPITAL_BASED_OUTPATIENT_CLINIC_OR_DEPARTMENT_OTHER): Payer: Self-pay

## 2017-12-24 ENCOUNTER — Encounter (HOSPITAL_BASED_OUTPATIENT_CLINIC_OR_DEPARTMENT_OTHER): Payer: Medicaid Other

## 2017-12-24 ENCOUNTER — Other Ambulatory Visit: Payer: Self-pay | Admitting: Nurse Practitioner

## 2017-12-24 DIAGNOSIS — Z1231 Encounter for screening mammogram for malignant neoplasm of breast: Secondary | ICD-10-CM

## 2017-12-30 ENCOUNTER — Encounter (HOSPITAL_COMMUNITY): Payer: Self-pay | Admitting: *Deleted

## 2017-12-30 ENCOUNTER — Other Ambulatory Visit: Payer: Self-pay

## 2017-12-30 ENCOUNTER — Inpatient Hospital Stay (HOSPITAL_COMMUNITY)
Admission: AD | Admit: 2017-12-30 | Discharge: 2017-12-30 | Disposition: A | Discharge status: Home / Self Care | Payer: Medicaid Other | Source: Ambulatory Visit | Attending: Obstetrics and Gynecology | Admitting: Obstetrics and Gynecology

## 2017-12-30 DIAGNOSIS — Z87891 Personal history of nicotine dependence: Secondary | ICD-10-CM | POA: Diagnosis not present

## 2017-12-30 DIAGNOSIS — N3001 Acute cystitis with hematuria: Secondary | ICD-10-CM | POA: Insufficient documentation

## 2017-12-30 DIAGNOSIS — R300 Dysuria: Secondary | ICD-10-CM | POA: Diagnosis present

## 2017-12-30 LAB — URINALYSIS, ROUTINE W REFLEX MICROSCOPIC
Bilirubin Urine: NEGATIVE
Glucose, UA: NEGATIVE mg/dL
KETONES UR: NEGATIVE mg/dL
Nitrite: NEGATIVE
PH: 7 (ref 5.0–8.0)
Protein, ur: 30 mg/dL — AB
RBC / HPF: >50 RBC/hpf — ABNORMAL HIGH (ref 0–5)
SPECIFIC GRAVITY, URINE: 1.013 (ref 1.005–1.030)
WBC, UA: >50 WBC/hpf — ABNORMAL HIGH (ref 0–5)

## 2017-12-30 MED ORDER — PHENAZOPYRIDINE HCL 200 MG PO TABS: 200.0000 mg | ORAL_TABLET | Freq: Three times a day (TID) | ORAL | 0 refills | Status: AC

## 2017-12-30 MED ORDER — NITROFURANTOIN MONOHYD MACRO 100 MG PO CAPS: 100.0000 mg | ORAL_CAPSULE | Freq: Two times a day (BID) | ORAL | 0 refills | Status: DC
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
## 2017-12-30 NOTE — Discharge Instructions (Signed)

## 2017-12-30 NOTE — MAU Note (Signed)
Kind of thinks she is having a UTI, tried to get in with her dr, was unable.  Frequency, urgency and pain with urination.  Little d/c.

## 2018-01-01 LAB — URINE CULTURE: Culture: 20000 — AB

## 2018-01-27 ENCOUNTER — Inpatient Hospital Stay (HOSPITAL_COMMUNITY)
Admission: AD | Admit: 2018-01-27 | Discharge: 2018-01-27 | Disposition: A | Discharge status: Home / Self Care | Payer: Medicaid Other | Source: Ambulatory Visit | Attending: Obstetrics and Gynecology | Admitting: Obstetrics and Gynecology

## 2018-01-27 ENCOUNTER — Encounter (HOSPITAL_COMMUNITY): Payer: Self-pay | Admitting: *Deleted

## 2018-01-27 ENCOUNTER — Other Ambulatory Visit: Payer: Self-pay

## 2018-01-27 DIAGNOSIS — Z3202 Encounter for pregnancy test, result negative: Secondary | ICD-10-CM | POA: Insufficient documentation

## 2018-01-27 DIAGNOSIS — N898 Other specified noninflammatory disorders of vagina: Secondary | ICD-10-CM | POA: Diagnosis not present

## 2018-01-27 DIAGNOSIS — Z87891 Personal history of nicotine dependence: Secondary | ICD-10-CM | POA: Insufficient documentation

## 2018-01-27 LAB — WET PREP, GENITAL
Clue Cells Wet Prep HPF POC: NONE SEEN
Sperm: NONE SEEN
Trich, Wet Prep: NONE SEEN
Yeast Wet Prep HPF POC: NONE SEEN

## 2018-01-27 LAB — URINALYSIS, ROUTINE W REFLEX MICROSCOPIC
Bilirubin Urine: NEGATIVE
GLUCOSE, UA: NEGATIVE mg/dL
Hgb urine dipstick: NEGATIVE
KETONES UR: NEGATIVE mg/dL
LEUKOCYTES UA: NEGATIVE
NITRITE: NEGATIVE
PH: 5 (ref 5.0–8.0)
Protein, ur: NEGATIVE mg/dL
SPECIFIC GRAVITY, URINE: 1.003 — AB (ref 1.005–1.030)

## 2018-01-27 LAB — POCT PREGNANCY, URINE: Preg Test, Ur: NEGATIVE

## 2018-01-27 MED ORDER — METRONIDAZOLE 500 MG PO TABS: 500.0000 mg | ORAL_TABLET | Freq: Two times a day (BID) | ORAL | 0 refills | Status: AC

## 2018-01-27 NOTE — MAU Note (Signed)
?   BV or UTI. Contacted dr's office, couldn't get seen.  "odor, some irritation."
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
## 2018-01-27 NOTE — MAU Note (Addendum)
Blind swabs collected by RN, pt to lobby to wait for results.

## 2018-01-27 NOTE — Discharge Instructions (Signed)
In late February 2020, the [facility] will be [facility]. At that time, the MAU (Maternity Admissions Unit), where you are being seen today, will no longer take care of non-pregnant patients. We strongly encourage you to find a doctor's office before that time, so that you can be seen with any GYN concerns, like vaginal discharge, urinary tract infection, etc.. in a timely manner.  In order to make an office visit more convenient, the Center for [facility] will be offering evening hours with same-day appointments, walk-in appointments and scheduled appointments available during this time.  Center for [facility] @ [facility] Hours: Monday - 8am - 7:30 pm with walk-in between 4pm- 7:30 pm Tuesday - 8 am - 5 pm (starting 05/27/17 we will be open late and accepting walk-ins from 4pm - 7:30pm) Wednesday - 8 am - 5 pm (starting 08/27/17 we will be open late and accepting walk-ins from 4pm - 7:30pm) Thursday 8 am - 5 pm (starting 11/27/17 we will be open late and accepting walk-ins from 4pm - 7:30pm) Friday 8 am - 5 pm  For an appointment please call the Center for [facility] @ [facility] at [phone]  For urgent needs, [facility] is also available for management of urgent GYN complaints such as vaginal discharge or urinary tract infections.

## 2018-01-28 ENCOUNTER — Other Ambulatory Visit: Payer: Self-pay | Admitting: Obstetrics and Gynecology

## 2018-01-28 DIAGNOSIS — A749 Chlamydial infection, unspecified: Secondary | ICD-10-CM

## 2018-01-28 LAB — GC/CHLAMYDIA PROBE AMP (~~LOC~~) NOT AT ARMC
Chlamydia: POSITIVE — AB
NEISSERIA GONORRHEA: NEGATIVE

## 2018-01-28 MED ORDER — AZITHROMYCIN 500 MG PO TABS: 1000.0000 mg | ORAL_TABLET | Freq: Once | ORAL | 0 refills | Status: AC

## 2018-01-28 NOTE — Progress Notes (Signed)
My Chart message sent to patient to notify of (+) chlamydia results and Rx sent to pharmacy

## 2018-02-03 ENCOUNTER — Ambulatory Visit
Admission: RE | Admit: 2018-02-03 | Discharge: 2018-02-03 | Disposition: A | Discharge status: Home / Self Care | Payer: Medicaid Other | Source: Ambulatory Visit | Attending: Nurse Practitioner | Admitting: Nurse Practitioner

## 2018-02-03 DIAGNOSIS — Z1231 Encounter for screening mammogram for malignant neoplasm of breast: Secondary | ICD-10-CM

## 2018-02-25 ENCOUNTER — Other Ambulatory Visit: Payer: Self-pay | Admitting: Neurology

## 2018-04-07 ENCOUNTER — Telehealth: Payer: Self-pay | Admitting: *Deleted

## 2018-04-07 NOTE — Telephone Encounter (Signed)
Tried all numbers listed and could not reach pt. Was going to offer appt tomorrow. She was on cx list for sooner appt.

## 2018-04-08 ENCOUNTER — Ambulatory Visit: Payer: Medicaid Other | Admitting: Neurology

## 2018-04-08 ENCOUNTER — Encounter: Payer: Self-pay | Admitting: Neurology

## 2018-04-08 ENCOUNTER — Other Ambulatory Visit: Payer: Self-pay

## 2018-04-08 VITALS — BP 118/80 | HR 93 | Ht 64.0 in | Wt 153.0 lb

## 2018-04-08 DIAGNOSIS — M5412 Radiculopathy, cervical region: Secondary | ICD-10-CM

## 2018-04-08 DIAGNOSIS — M542 Cervicalgia: Secondary | ICD-10-CM

## 2018-04-08 DIAGNOSIS — G43709 Chronic migraine without aura, not intractable, without status migrainosus: Secondary | ICD-10-CM | POA: Diagnosis not present

## 2018-04-08 DIAGNOSIS — IMO0002 Reserved for concepts with insufficient information to code with codable children: Secondary | ICD-10-CM

## 2018-04-08 DIAGNOSIS — R51 Headache: Secondary | ICD-10-CM

## 2018-04-08 DIAGNOSIS — R519 Headache, unspecified: Secondary | ICD-10-CM | POA: Insufficient documentation

## 2018-04-08 MED ORDER — SUMATRIPTAN SUCCINATE 100 MG PO TABS: ORAL_TABLET | ORAL | 3 refills | Status: DC

## 2018-04-08 MED ORDER — INDOMETHACIN 25 MG PO CAPS: 25.0000 mg | ORAL_CAPSULE | Freq: Three times a day (TID) | ORAL | 0 refills | Status: DC | PRN
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
## 2018-04-09 ENCOUNTER — Telehealth: Payer: Self-pay | Admitting: Neurology

## 2018-04-09 ENCOUNTER — Telehealth: Payer: Self-pay | Admitting: *Deleted

## 2018-04-09 NOTE — Telephone Encounter (Signed)
Faxed completed/signed PA sumatriptan 100mg  tablet to [facility] at [phone]. Received fax confirmation. Waiting on determination.

## 2018-04-09 NOTE — Telephone Encounter (Signed)
Medicaid order sent to [facility]. They will obtain the auth and reach out to the pt to schedule.  °

## 2018-04-14 ENCOUNTER — Other Ambulatory Visit: Payer: Medicaid Other

## 2018-04-15 NOTE — Telephone Encounter (Signed)
I checked status of PA. PA was denied.  [facility] called [facility] to see if they could run rx for #9 instead of #10. They already did this and PA still needed. Advised PA completed and denied. They will offer her option to use goodrx coupon instead. She can get rx for 21-22 dollars with this.  I called [facility] to see why PA was denied. Spoke with Patsy.  Recipient dose not meet clinical criteria but then states pt allowed #12 without a PA. Advised pharmacy still getting rejection stating PA needed. She looked further into this for me and stated more questions needed to be answered under the exceeding quantity limit. I answered these questions over the phone and she submitted to the pharmacist. PA pending for clinical review. OX#[id number]. Ref# for call: [id number].

## 2018-04-22 NOTE — Telephone Encounter (Signed)
I called [facility] and spoke with [facility]. PA denied. No PA needed unless over the qty limit of #12/30 days. Pt was trying to fill rx too early since she received #9 sumatriptan 100mg  tab on 04/01/18 from different provider. This is why it was not going through on the pharmacy end.  Ref# for call: [id number].

## 2018-05-21 ENCOUNTER — Ambulatory Visit: Payer: Medicaid Other | Admitting: Neurology

## 2018-06-18 ENCOUNTER — Other Ambulatory Visit: Payer: Self-pay | Admitting: Neurology

## 2018-06-18 MED ORDER — IMIPRAMINE HCL 25 MG PO TABS: ORAL_TABLET | ORAL | 3 refills | Status: DC

## 2018-06-22 ENCOUNTER — Other Ambulatory Visit: Payer: Self-pay | Admitting: Neurology

## 2018-06-22 MED ORDER — SUMATRIPTAN SUCCINATE 100 MG PO TABS: ORAL_TABLET | ORAL | 3 refills | Status: DC

## 2018-07-31 ENCOUNTER — Other Ambulatory Visit: Payer: Self-pay | Admitting: Neurosurgery

## 2018-09-10 ENCOUNTER — Other Ambulatory Visit: Payer: Self-pay | Admitting: Neurosurgery

## 2018-10-09 ENCOUNTER — Encounter (HOSPITAL_COMMUNITY): Admission: RE | Payer: Self-pay | Source: Home / Self Care

## 2018-10-09 ENCOUNTER — Ambulatory Visit (HOSPITAL_COMMUNITY): Admission: RE | Admit: 2018-10-09 | Payer: Medicaid Other | Source: Home / Self Care | Admitting: Neurosurgery

## 2018-10-09 SURGERY — ANTERIOR CERVICAL DECOMPRESSION/DISCECTOMY FUSION 1 LEVEL
Anesthesia: General

## 2018-10-23 ENCOUNTER — Other Ambulatory Visit: Payer: Self-pay | Admitting: Neurosurgery

## 2018-10-26 ENCOUNTER — Other Ambulatory Visit: Payer: Self-pay | Admitting: Neurosurgery

## 2018-10-27 ENCOUNTER — Other Ambulatory Visit: Payer: Self-pay | Admitting: Neurology

## 2018-10-28 NOTE — Pre-Procedure Instructions (Signed)
[id number] [place], [place] - [address] [place] [address] Phone: [phone] Fax: [phone]  [id number] [address] - [address] [place] [address] Phone: [phone] Fax: [phone]      Your procedure is scheduled on Friday, September 4th.  Report to [facility] Entr[facility] "A" at 5:30 A.M., and check in at the Admitting office.  Call this number if you have problems the morning of surgery:  [phone]  Call [phone] if you have any questions prior to your surgery date Monday-Friday 8am-4pm    Remember:  Do not eat or drink after midnight the night before your surgery    Take these medicines the morning of surgery with A SIP OF WATER  AZELASTINE-FLUTICASONE  As needed:  Acetaminophen-for pain clonazePAM (KLONOPIN) or LORazepam (ATIVAN)  - for anxiety Eye drops  promethazine (PHENERGAN)-for nausea/vomiting SUMAtriptan (IMITREX)-for headaches  As of today, STOP taking any Aspirin (unless otherwise instructed by your surgeon), Aleve, Naproxen, Ibuprofen, Motrin, Advil, Goody's, BC's, all herbal medications, fish oil, and all vitamins. Including: indomethacin (INDOCIN)     The Morning of Surgery  Do not wear jewelry, make-up or nail polish.  Do not wear lotions, powders, or perfumes/colognes, or deodorant  Do not shave 48 hours prior to surgery.    Do not bring valuables to the hospital.  [facility] is not responsible for any belongings or valuables.  If you are a smoker, DO NOT Smoke 24 hours prior to surgery IF you wear a CPAP at night please bring your mask, tubing, and machine the morning of surgery   Remember that you must have someone to transport you home after your surgery, and remain with you for 24 hours if you are discharged the same day.   Contacts, glasses, hearing aids, dentures or bridgework may not be worn into  surgery.    Leave your suitcase in the car.  After surgery it may be brought to your room.  For patients admitted to the hospital, discharge time will be determined by your treatment team.  Patients discharged the day of surgery will not be allowed to drive home.    Special instructions:   [place]- Preparing For Surgery  Before surgery, you can play an important role. Because skin is not sterile, your skin needs to be as free of germs as possible. You can reduce the number of germs on your skin by washing with CHG (chlorahexidine gluconate) Soap before surgery.  CHG is an antiseptic cleaner which kills germs and bonds with the skin to continue killing germs even after washing.    Oral Hygiene is also important to reduce your risk of infection.  Remember - BRUSH YOUR TEETH THE MORNING OF SURGERY WITH YOUR REGULAR TOOTHPASTE  Please do not use if you have an allergy to CHG or antibacterial soaps. If your skin becomes reddened/irritated stop using the CHG.  Do not shave (including legs and underarms) for at least 48 hours prior to first CHG shower. It is OK to shave your face.  Please follow these instructions carefully.   1. Shower the NIGHT BEFORE SURGERY and the MORNING OF SURGERY with CHG Soap.   2. If you chose to wash your hair, wash your hair first as usual with your normal shampoo.  3. After you shampoo, rinse your hair and body thoroughly to remove the shampoo.  4. Use CHG as you would any other liquid  soap. You can apply CHG directly to the skin and wash gently with a scrungie or a clean washcloth.   5. Apply the CHG Soap to your body ONLY FROM THE NECK DOWN.  Do not use on open wounds or open sores. Avoid contact with your eyes, ears, mouth and genitals (private parts). Wash Face and genitals (private parts)  with your normal soap.   6. Wash thoroughly, paying special attention to the area where your surgery will be performed.  7. Thoroughly rinse your body with warm  water from the neck down.  8. DO NOT shower/wash with your normal soap after using and rinsing off the CHG Soap.  9. Pat yourself dry with a CLEAN TOWEL.  10. Wear CLEAN PAJAMAS to bed the night before surgery, wear comfortable clothes the morning of surgery  11. Place CLEAN SHEETS on your bed the night of your first shower and DO NOT SLEEP WITH PETS.    Day of Surgery:  Do not apply any deodorants/lotions. Please shower the morning of surgery with the CHG soap  Please wear clean clothes to the hospital/surgery center.   Remember to brush your teeth WITH YOUR REGULAR TOOTHPASTE.   Please read over the following fact sheets that you were given.

## 2018-10-29 ENCOUNTER — Encounter (HOSPITAL_COMMUNITY)
Admission: RE | Admit: 2018-10-29 | Discharge: 2018-10-29 | Disposition: A | Discharge status: Home / Self Care | Payer: Medicaid Other | Source: Ambulatory Visit | Attending: Neurosurgery | Admitting: Neurosurgery

## 2018-10-29 ENCOUNTER — Other Ambulatory Visit (HOSPITAL_COMMUNITY)
Admission: RE | Admit: 2018-10-29 | Discharge: 2018-10-29 | Disposition: A | Discharge status: Home / Self Care | Payer: Medicaid Other | Source: Ambulatory Visit | Attending: Neurosurgery | Admitting: Neurosurgery

## 2018-10-29 ENCOUNTER — Encounter (HOSPITAL_COMMUNITY): Payer: Self-pay

## 2018-10-29 ENCOUNTER — Other Ambulatory Visit: Payer: Self-pay

## 2018-10-29 DIAGNOSIS — Z01812 Encounter for preprocedural laboratory examination: Secondary | ICD-10-CM | POA: Insufficient documentation

## 2018-10-29 DIAGNOSIS — Z0184 Encounter for antibody response examination: Secondary | ICD-10-CM | POA: Diagnosis not present

## 2018-10-29 DIAGNOSIS — Z20828 Contact with and (suspected) exposure to other viral communicable diseases: Secondary | ICD-10-CM | POA: Diagnosis not present

## 2018-10-29 HISTORY — DX: Unspecified osteoarthritis, unspecified site: M19.90

## 2018-10-29 LAB — TYPE AND SCREEN
ABO/RH(D): B POS
Antibody Screen: NEGATIVE

## 2018-10-29 LAB — CBC
HCT: 42.9 % (ref 36.0–46.0)
Hemoglobin: 13.6 g/dL (ref 12.0–15.0)
MCH: 30.2 pg (ref 26.0–34.0)
MCHC: 31.7 g/dL (ref 30.0–36.0)
MCV: 95.3 fL (ref 80.0–100.0)
Platelets: 338 10*3/uL (ref 150–400)
RBC: 4.5 MIL/uL (ref 3.87–5.11)
RDW: 13 % (ref 11.5–15.5)
WBC: 7.1 10*3/uL (ref 4.0–10.5)
nRBC: 0 % (ref 0.0–0.2)

## 2018-10-29 LAB — SURGICAL PCR SCREEN
MRSA, PCR: NEGATIVE
Staphylococcus aureus: NEGATIVE

## 2018-10-29 LAB — ABO/RH: ABO/RH(D): B POS

## 2018-10-29 LAB — SARS CORONAVIRUS 2 (TAT 6-24 HRS): SARS Coronavirus 2: NEGATIVE
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
## 2018-10-29 NOTE — Anesthesia Preprocedure Evaluation (Addendum)
Anesthesia Evaluation  Patient identified by MRN, date of birth, ID band Patient awake    Reviewed: Allergy & Precautions, NPO status , Patient's Chart, lab work & pertinent test results  Airway Mallampati: II  TM Distance: >3 FB Neck ROM: Limited    Dental no notable dental hx.    Pulmonary former smoker,    Pulmonary exam normal breath sounds clear to auscultation       Cardiovascular negative cardio ROS Normal cardiovascular exam Rhythm:Regular Rate:Normal     Neuro/Psych  Headaches, PSYCHIATRIC DISORDERS Anxiety Depression  Neuromuscular disease    GI/Hepatic Neg liver ROS, GERD  Controlled,  Endo/Other  negative endocrine ROS  Renal/GU negative Renal ROS     Musculoskeletal negative musculoskeletal ROS (+)   Abdominal   Peds  Hematology negative hematology ROS (+)   Anesthesia Other Findings DISPLACEMENT OF CERVICAL INTERVERTEBRAL DISC  Reproductive/Obstetrics hcg negative                            Anesthesia Physical Anesthesia Plan  ASA: II  Anesthesia Plan: General   Post-op Pain Management:    Induction: Intravenous  PONV Risk Score and Plan: 3 and Ondansetron, Dexamethasone, Midazolam and Treatment may vary due to age or medical condition  Airway Management Planned: Oral ETT  Additional Equipment:   Intra-op Plan:   Post-operative Plan: Extubation in OR  Informed Consent: I have reviewed the patients History and Physical, chart, labs and discussed the procedure including the risks, benefits and alternatives for the proposed anesthesia with the patient or authorized representative who has indicated his/her understanding and acceptance.     Dental advisory given  Plan Discussed with: CRNA  Anesthesia Plan Comments:       Anesthesia Quick Evaluation

## 2018-10-29 NOTE — Progress Notes (Signed)
PCP -  Vicenta Aly. FNP Cardiologist - denies  Chest x-ray - N/A EKG - N/A Stress Test - denies ECHO - denies Cardiac Cath - denies  Sleep Study - N/A CPAP - denies  Blood Thinner Instructions: N/A Aspirin Instructions: N/A  Anesthesia review: No  Patient denies shortness of breath, fever, cough and chest pain at PAT appointment   Patient verbalized understanding of instructions that were given to them at the PAT appointment. Patient was also instructed that they will need to review over the PAT instructions again at home before surgery.    Coronavirus Screening - Patient to be screened today, 10/29/18, and aware of need to quarantine until procedure.  Have you experienced the following symptoms:  Cough yes/no: No Fever (>100.43F)  yes/no: No Runny nose yes/no: No Sore throat yes/no: No Difficulty breathing/shortness of breath  yes/no: No  Have you or a family member traveled in the last 14 days and where? yes/no: No   If the patient indicates "YES" to the above questions, their PAT will be rescheduled to limit the exposure to others and, the surgeon will be notified. THE PATIENT WILL NEED TO BE ASYMPTOMATIC FOR 14 DAYS.   If the patient is not experiencing any of these symptoms, the PAT nurse will instruct them to NOT bring anyone with them to their appointment since they may have these symptoms or traveled as well.   Please remind your patients and families that hospital visitation restrictions are in effect and the importance of the restrictions.

## 2018-10-30 ENCOUNTER — Encounter (HOSPITAL_COMMUNITY)
Admission: RE | Disposition: A | Discharge status: Home / Self Care | Payer: Self-pay | Source: Home / Self Care | Attending: Neurosurgery

## 2018-10-30 ENCOUNTER — Ambulatory Visit (HOSPITAL_COMMUNITY): Payer: Medicaid Other

## 2018-10-30 ENCOUNTER — Encounter (HOSPITAL_COMMUNITY): Payer: Self-pay | Admitting: General Practice

## 2018-10-30 ENCOUNTER — Ambulatory Visit (HOSPITAL_COMMUNITY): Payer: Medicaid Other | Admitting: Certified Registered Nurse Anesthetist

## 2018-10-30 ENCOUNTER — Observation Stay (HOSPITAL_COMMUNITY)
Admission: RE | Admit: 2018-10-30 | Discharge: 2018-10-30 | Disposition: A | Discharge status: Home / Self Care | Payer: Medicaid Other | Attending: Neurosurgery | Admitting: Neurosurgery

## 2018-10-30 DIAGNOSIS — Z79899 Other long term (current) drug therapy: Secondary | ICD-10-CM | POA: Insufficient documentation

## 2018-10-30 DIAGNOSIS — M4722 Other spondylosis with radiculopathy, cervical region: Secondary | ICD-10-CM | POA: Diagnosis not present

## 2018-10-30 DIAGNOSIS — Z8719 Personal history of other diseases of the digestive system: Secondary | ICD-10-CM | POA: Insufficient documentation

## 2018-10-30 DIAGNOSIS — F419 Anxiety disorder, unspecified: Secondary | ICD-10-CM | POA: Diagnosis not present

## 2018-10-30 DIAGNOSIS — Z881 Allergy status to other antibiotic agents status: Secondary | ICD-10-CM | POA: Diagnosis not present

## 2018-10-30 DIAGNOSIS — M4802 Spinal stenosis, cervical region: Secondary | ICD-10-CM | POA: Diagnosis present

## 2018-10-30 DIAGNOSIS — G43909 Migraine, unspecified, not intractable, without status migrainosus: Secondary | ICD-10-CM | POA: Diagnosis not present

## 2018-10-30 DIAGNOSIS — K219 Gastro-esophageal reflux disease without esophagitis: Secondary | ICD-10-CM | POA: Diagnosis not present

## 2018-10-30 DIAGNOSIS — L409 Psoriasis, unspecified: Secondary | ICD-10-CM | POA: Insufficient documentation

## 2018-10-30 DIAGNOSIS — Z833 Family history of diabetes mellitus: Secondary | ICD-10-CM | POA: Insufficient documentation

## 2018-10-30 DIAGNOSIS — M2578 Osteophyte, vertebrae: Secondary | ICD-10-CM | POA: Diagnosis not present

## 2018-10-30 DIAGNOSIS — M5412 Radiculopathy, cervical region: Secondary | ICD-10-CM | POA: Diagnosis present

## 2018-10-30 DIAGNOSIS — F329 Major depressive disorder, single episode, unspecified: Secondary | ICD-10-CM | POA: Diagnosis not present

## 2018-10-30 DIAGNOSIS — Z87891 Personal history of nicotine dependence: Secondary | ICD-10-CM | POA: Insufficient documentation

## 2018-10-30 DIAGNOSIS — Z419 Encounter for procedure for purposes other than remedying health state, unspecified: Secondary | ICD-10-CM

## 2018-10-30 DIAGNOSIS — Z8249 Family history of ischemic heart disease and other diseases of the circulatory system: Secondary | ICD-10-CM | POA: Insufficient documentation

## 2018-10-30 DIAGNOSIS — M1990 Unspecified osteoarthritis, unspecified site: Secondary | ICD-10-CM | POA: Insufficient documentation

## 2018-10-30 DIAGNOSIS — Z888 Allergy status to other drugs, medicaments and biological substances status: Secondary | ICD-10-CM | POA: Insufficient documentation

## 2018-10-30 DIAGNOSIS — Z809 Family history of malignant neoplasm, unspecified: Secondary | ICD-10-CM | POA: Diagnosis not present

## 2018-10-30 HISTORY — PX: ANTERIOR CERVICAL DECOMP/DISCECTOMY FUSION: SHX1161

## 2018-10-30 LAB — POCT PREGNANCY, URINE: Preg Test, Ur: NEGATIVE

## 2018-10-30 SURGERY — ANTERIOR CERVICAL DECOMPRESSION/DISCECTOMY FUSION 2 LEVELS
Anesthesia: General

## 2018-10-30 MED ORDER — PROPOFOL 10 MG/ML IV BOLUS
INTRAVENOUS | Status: DC | PRN
  Administered 2018-10-30: 200 mg via INTRAVENOUS

## 2018-10-30 MED ORDER — OXYCODONE HCL 5 MG/5ML PO SOLN: 5.0000 mg | Freq: Once | ORAL | Status: DC | PRN

## 2018-10-30 MED ORDER — OXYCODONE-ACETAMINOPHEN 7.5-325 MG PO TABS: 1.0000 | ORAL_TABLET | ORAL | 0 refills | Status: DC | PRN

## 2018-10-30 MED ORDER — METHOCARBAMOL 1000 MG/10ML IJ SOLN
500.0000 mg | Freq: Four times a day (QID) | INTRAVENOUS | Status: DC | PRN
  Filled 2018-10-30: qty 5

## 2018-10-30 MED ORDER — GABAPENTIN 300 MG PO CAPS
300.0000 mg | ORAL_CAPSULE | Freq: Three times a day (TID) | ORAL | Status: DC
  Administered 2018-10-30: 13:00:00 300 mg via ORAL
  Filled 2018-10-30: qty 1

## 2018-10-30 MED ORDER — FLEET ENEMA 7-19 GM/118ML RE ENEM: 1.0000 | ENEMA | Freq: Once | RECTAL | Status: DC | PRN

## 2018-10-30 MED ORDER — CHLORHEXIDINE GLUCONATE CLOTH 2 % EX PADS: 6.0000 | MEDICATED_PAD | Freq: Once | CUTANEOUS | Status: DC

## 2018-10-30 MED ORDER — FENTANYL CITRATE (PF) 250 MCG/5ML IJ SOLN
INTRAMUSCULAR | Status: DC | PRN
  Administered 2018-10-30: 25 ug via INTRAVENOUS
  Administered 2018-10-30 (×2): 50 ug via INTRAVENOUS
  Administered 2018-10-30: 100 ug via INTRAVENOUS
  Administered 2018-10-30: 25 ug via INTRAVENOUS

## 2018-10-30 MED ORDER — HYDROMORPHONE HCL 1 MG/ML IJ SOLN
0.2500 mg | INTRAMUSCULAR | Status: DC | PRN
  Administered 2018-10-30 (×2): 0.5 mg via INTRAVENOUS

## 2018-10-30 MED ORDER — SODIUM CHLORIDE 0.9 % IV SOLN: INTRAVENOUS | Status: DC

## 2018-10-30 MED ORDER — SODIUM CHLORIDE 0.9 % IV SOLN: 250.0000 mL | INTRAVENOUS | Status: DC

## 2018-10-30 MED ORDER — DEXMEDETOMIDINE HCL IN NACL 200 MCG/50ML IV SOLN
INTRAVENOUS | Status: DC | PRN
  Administered 2018-10-30: .4 ug/kg/h via INTRAVENOUS

## 2018-10-30 MED ORDER — SENNA 8.6 MG PO TABS
1.0000 | ORAL_TABLET | Freq: Two times a day (BID) | ORAL | Status: DC
  Administered 2018-10-30: 8.6 mg via ORAL
  Filled 2018-10-30: qty 1

## 2018-10-30 MED ORDER — SODIUM CHLORIDE 0.9% FLUSH: 3.0000 mL | INTRAVENOUS | Status: DC | PRN

## 2018-10-30 MED ORDER — SODIUM CHLORIDE 0.9 % IV SOLN
INTRAVENOUS | Status: DC | PRN
  Administered 2018-10-30: 09:00:00 10 ug/min via INTRAVENOUS

## 2018-10-30 MED ORDER — ONDANSETRON HCL 4 MG/2ML IJ SOLN: 4.0000 mg | Freq: Four times a day (QID) | INTRAMUSCULAR | Status: DC | PRN

## 2018-10-30 MED ORDER — ONDANSETRON HCL 4 MG/2ML IJ SOLN
INTRAMUSCULAR | Status: DC | PRN
  Administered 2018-10-30: 4 mg via INTRAVENOUS

## 2018-10-30 MED ORDER — LACTATED RINGERS IV SOLN
INTRAVENOUS | Status: DC | PRN
  Administered 2018-10-30: 07:00:00 via INTRAVENOUS

## 2018-10-30 MED ORDER — CEFAZOLIN SODIUM-DEXTROSE 2-4 GM/100ML-% IV SOLN
INTRAVENOUS | Status: AC
  Filled 2018-10-30: qty 100

## 2018-10-30 MED ORDER — OXYCODONE HCL 5 MG PO TABS
5.0000 mg | ORAL_TABLET | ORAL | Status: DC | PRN
  Administered 2018-10-30: 10 mg via ORAL
  Filled 2018-10-30: qty 2

## 2018-10-30 MED ORDER — MENTHOL 3 MG MT LOZG: 1.0000 | LOZENGE | OROMUCOSAL | Status: DC | PRN

## 2018-10-30 MED ORDER — LIDOCAINE-EPINEPHRINE 1 %-1:100000 IJ SOLN
INTRAMUSCULAR | Status: DC | PRN
  Administered 2018-10-30: 5 mL

## 2018-10-30 MED ORDER — ESCITALOPRAM OXALATE 10 MG PO TABS
10.0000 mg | ORAL_TABLET | Freq: Every day | ORAL | Status: DC
  Filled 2018-10-30: qty 1

## 2018-10-30 MED ORDER — NORGESTIMATE-ETH ESTRADIOL 0.25-35 MG-MCG PO TABS: 1.0000 | ORAL_TABLET | Freq: Every day | ORAL | Status: DC

## 2018-10-30 MED ORDER — PROMETHAZINE HCL 25 MG PO TABS
25.0000 mg | ORAL_TABLET | Freq: Four times a day (QID) | ORAL | Status: DC | PRN
  Filled 2018-10-30: qty 1

## 2018-10-30 MED ORDER — ACETAMINOPHEN 500 MG PO TABS
ORAL_TABLET | ORAL | Status: AC
  Administered 2018-10-30: 1000 mg via ORAL
  Filled 2018-10-30: qty 2

## 2018-10-30 MED ORDER — LIDOCAINE 2% (20 MG/ML) 5 ML SYRINGE
INTRAMUSCULAR | Status: DC | PRN
  Administered 2018-10-30: 60 mg via INTRAVENOUS

## 2018-10-30 MED ORDER — METHOCARBAMOL 750 MG PO TABS: 750.0000 mg | ORAL_TABLET | Freq: Three times a day (TID) | ORAL | 1 refills | Status: DC | PRN

## 2018-10-30 MED ORDER — 0.9 % SODIUM CHLORIDE (POUR BTL) OPTIME
TOPICAL | Status: DC | PRN
  Administered 2018-10-30: 1000 mL

## 2018-10-30 MED ORDER — BUPIVACAINE HCL 0.5 % IJ SOLN
INTRAMUSCULAR | Status: DC | PRN
  Administered 2018-10-30: 5 mL

## 2018-10-30 MED ORDER — ACETAMINOPHEN 650 MG RE SUPP: 650.0000 mg | RECTAL | Status: DC | PRN

## 2018-10-30 MED ORDER — OXYCODONE HCL 5 MG PO TABS: 5.0000 mg | ORAL_TABLET | Freq: Once | ORAL | Status: DC | PRN

## 2018-10-30 MED ORDER — METHOCARBAMOL 500 MG PO TABS
500.0000 mg | ORAL_TABLET | Freq: Four times a day (QID) | ORAL | Status: DC | PRN
  Administered 2018-10-30: 500 mg via ORAL
  Filled 2018-10-30: qty 1

## 2018-10-30 MED ORDER — LIDOCAINE-EPINEPHRINE 1 %-1:100000 IJ SOLN
INTRAMUSCULAR | Status: AC
  Filled 2018-10-30: qty 1

## 2018-10-30 MED ORDER — CEFAZOLIN SODIUM-DEXTROSE 2-4 GM/100ML-% IV SOLN
2.0000 g | INTRAVENOUS | Status: AC
  Administered 2018-10-30: 08:00:00 2 g via INTRAVENOUS

## 2018-10-30 MED ORDER — SODIUM CHLORIDE 0.9% FLUSH: 3.0000 mL | Freq: Two times a day (BID) | INTRAVENOUS | Status: DC

## 2018-10-30 MED ORDER — THROMBIN 5000 UNITS EX SOLR
OROMUCOSAL | Status: DC | PRN
  Administered 2018-10-30: 5 mL via TOPICAL

## 2018-10-30 MED ORDER — KETOROLAC TROMETHAMINE 30 MG/ML IJ SOLN: 30.0000 mg | Freq: Once | INTRAMUSCULAR | Status: DC | PRN

## 2018-10-30 MED ORDER — ACETAMINOPHEN 500 MG PO TABS
1000.0000 mg | ORAL_TABLET | Freq: Four times a day (QID) | ORAL | Status: DC
  Administered 2018-10-30: 1000 mg via ORAL
  Filled 2018-10-30: qty 2

## 2018-10-30 MED ORDER — PROPOFOL 10 MG/ML IV BOLUS
INTRAVENOUS | Status: AC
  Filled 2018-10-30: qty 20

## 2018-10-30 MED ORDER — MIDAZOLAM HCL 2 MG/2ML IJ SOLN
INTRAMUSCULAR | Status: AC
  Filled 2018-10-30: qty 2

## 2018-10-30 MED ORDER — HYDROMORPHONE HCL 1 MG/ML IJ SOLN: 0.5000 mg | INTRAMUSCULAR | Status: DC | PRN

## 2018-10-30 MED ORDER — ACETAMINOPHEN 500 MG PO TABS
1000.0000 mg | ORAL_TABLET | Freq: Once | ORAL | Status: AC
  Administered 2018-10-30: 07:00:00 1000 mg via ORAL

## 2018-10-30 MED ORDER — BUPIVACAINE HCL (PF) 0.5 % IJ SOLN
INTRAMUSCULAR | Status: AC
  Filled 2018-10-30: qty 30

## 2018-10-30 MED ORDER — PHENOL 1.4 % MT LIQD: 1.0000 | OROMUCOSAL | Status: DC | PRN

## 2018-10-30 MED ORDER — DEXAMETHASONE SODIUM PHOSPHATE 10 MG/ML IJ SOLN
INTRAMUSCULAR | Status: DC | PRN
  Administered 2018-10-30: 10 mg via INTRAVENOUS

## 2018-10-30 MED ORDER — LORAZEPAM 0.5 MG PO TABS: 0.5000 mg | ORAL_TABLET | Freq: Every day | ORAL | Status: DC | PRN

## 2018-10-30 MED ORDER — PROMETHAZINE HCL 25 MG/ML IJ SOLN: 6.2500 mg | INTRAMUSCULAR | Status: DC | PRN

## 2018-10-30 MED ORDER — THROMBIN 20000 UNITS EX SOLR
CUTANEOUS | Status: AC
  Filled 2018-10-30: qty 20000

## 2018-10-30 MED ORDER — HYDROCODONE-ACETAMINOPHEN 5-325 MG PO TABS: 1.0000 | ORAL_TABLET | ORAL | Status: DC | PRN

## 2018-10-30 MED ORDER — CEFAZOLIN SODIUM-DEXTROSE 2-4 GM/100ML-% IV SOLN: 2.0000 g | Freq: Three times a day (TID) | INTRAVENOUS | Status: DC

## 2018-10-30 MED ORDER — SENNOSIDES-DOCUSATE SODIUM 8.6-50 MG PO TABS: 1.0000 | ORAL_TABLET | Freq: Every evening | ORAL | Status: DC | PRN

## 2018-10-30 MED ORDER — THROMBIN 5000 UNITS EX SOLR
CUTANEOUS | Status: AC
  Filled 2018-10-30: qty 5000

## 2018-10-30 MED ORDER — ROCURONIUM BROMIDE 10 MG/ML (PF) SYRINGE
PREFILLED_SYRINGE | INTRAVENOUS | Status: DC | PRN
  Administered 2018-10-30: 50 mg via INTRAVENOUS

## 2018-10-30 MED ORDER — SODIUM CHLORIDE 0.9 % IV SOLN
INTRAVENOUS | Status: DC | PRN
  Administered 2018-10-30: 07:00:00 500 mL

## 2018-10-30 MED ORDER — ONDANSETRON HCL 4 MG PO TABS: 4.0000 mg | ORAL_TABLET | Freq: Four times a day (QID) | ORAL | Status: DC | PRN

## 2018-10-30 MED ORDER — SUGAMMADEX SODIUM 200 MG/2ML IV SOLN
INTRAVENOUS | Status: DC | PRN
  Administered 2018-10-30: 150 mg via INTRAVENOUS

## 2018-10-30 MED ORDER — ACETAMINOPHEN 325 MG PO TABS: 650.0000 mg | ORAL_TABLET | ORAL | Status: DC | PRN

## 2018-10-30 MED ORDER — THROMBIN 20000 UNITS EX SOLR
CUTANEOUS | Status: DC | PRN
  Administered 2018-10-30: 07:00:00 20 mL via TOPICAL

## 2018-10-30 MED ORDER — MIDAZOLAM HCL 2 MG/2ML IJ SOLN
INTRAMUSCULAR | Status: DC | PRN
  Administered 2018-10-30: 1.5 mg via INTRAVENOUS
  Administered 2018-10-30: .5 mg via INTRAVENOUS

## 2018-10-30 MED ORDER — BISACODYL 10 MG RE SUPP: 10.0000 mg | Freq: Every day | RECTAL | Status: DC | PRN

## 2018-10-30 MED ORDER — DOCUSATE SODIUM 100 MG PO CAPS
100.0000 mg | ORAL_CAPSULE | Freq: Two times a day (BID) | ORAL | Status: DC
  Administered 2018-10-30: 100 mg via ORAL
  Filled 2018-10-30: qty 1

## 2018-10-30 MED ORDER — HYDROMORPHONE HCL 1 MG/ML IJ SOLN
INTRAMUSCULAR | Status: AC
  Filled 2018-10-30: qty 1

## 2018-10-30 MED ORDER — FENTANYL CITRATE (PF) 250 MCG/5ML IJ SOLN
INTRAMUSCULAR | Status: AC
  Filled 2018-10-30: qty 5

## 2018-10-30 SURGICAL SUPPLY — 70 items
BAG DECANTER FOR FLEXI CONT (MISCELLANEOUS) ×3 IMPLANT
BENZOIN TINCTURE PRP APPL 2/3 (GAUZE/BANDAGES/DRESSINGS) ×2 IMPLANT
BLADE CLIPPER SURG (BLADE) IMPLANT
BLADE SURG 11 STRL SS (BLADE) ×3 IMPLANT
BLADE ULTRA TIP 2M (BLADE) IMPLANT
BNDG GAUZE ELAST 4 BULKY (GAUZE/BANDAGES/DRESSINGS) IMPLANT
BUR MATCHSTICK NEURO 3.0 LAGG (BURR) ×3 IMPLANT
CAGE LORDOTIC 6 SM (Cage) ×2 IMPLANT
CAGE LORDOTIC 6MM SM (Cage) ×2 IMPLANT
CANISTER SUCT 3000ML PPV (MISCELLANEOUS) ×3 IMPLANT
CARTRIDGE OIL MAESTRO DRILL (MISCELLANEOUS) ×1 IMPLANT
CLOSURE WOUND 1/2 X4 (GAUZE/BANDAGES/DRESSINGS) ×1
COVER WAND RF STERILE (DRAPES) ×3 IMPLANT
DECANTER SPIKE VIAL GLASS SM (MISCELLANEOUS) ×3 IMPLANT
DERMABOND ADVANCED (GAUZE/BANDAGES/DRESSINGS) ×2
DERMABOND ADVANCED .7 DNX12 (GAUZE/BANDAGES/DRESSINGS) ×1 IMPLANT
DIFFUSER DRILL AIR PNEUMATIC (MISCELLANEOUS) ×3 IMPLANT
DRAIN CHANNEL 10M FLAT 3/4 FLT (DRAIN) IMPLANT
DRAPE C-ARM 42X72 X-RAY (DRAPES) ×6 IMPLANT
DRAPE HALF SHEET 40X57 (DRAPES) ×2 IMPLANT
DRAPE LAPAROTOMY 100X72 PEDS (DRAPES) ×3 IMPLANT
DRAPE MICROSCOPE LEICA (MISCELLANEOUS) ×3 IMPLANT
DRSG OPSITE 4X5.5 SM (GAUZE/BANDAGES/DRESSINGS) ×6 IMPLANT
DRSG OPSITE POSTOP 3X4 (GAUZE/BANDAGES/DRESSINGS) ×4 IMPLANT
DURAPREP 6ML APPLICATOR 50/CS (WOUND CARE) ×3 IMPLANT
ELECT COATED BLADE 2.86 ST (ELECTRODE) ×3 IMPLANT
ELECT REM PT RETURN 9FT ADLT (ELECTROSURGICAL) ×3
ELECTRODE REM PT RTRN 9FT ADLT (ELECTROSURGICAL) ×1 IMPLANT
EVACUATOR SILICONE 100CC (DRAIN) IMPLANT
GAUZE 4X4 16PLY RFD (DISPOSABLE) IMPLANT
GLOVE BIO SURGEON STRL SZ 6.5 (GLOVE) ×3 IMPLANT
GLOVE BIO SURGEON STRL SZ7.5 (GLOVE) IMPLANT
GLOVE BIO SURGEONS STRL SZ 6.5 (GLOVE) ×3
GLOVE BIOGEL PI IND STRL 6.5 (GLOVE) IMPLANT
GLOVE BIOGEL PI IND STRL 7.5 (GLOVE) ×2 IMPLANT
GLOVE BIOGEL PI INDICATOR 6.5 (GLOVE) ×2
GLOVE BIOGEL PI INDICATOR 7.5 (GLOVE) ×4
GLOVE ECLIPSE 7.0 STRL STRAW (GLOVE) ×5 IMPLANT
GLOVE ECLIPSE 7.5 STRL STRAW (GLOVE) ×2 IMPLANT
GLOVE EXAM NITRILE XL STR (GLOVE) IMPLANT
GOWN STRL REUS W/ TWL LRG LVL3 (GOWN DISPOSABLE) ×2 IMPLANT
GOWN STRL REUS W/ TWL XL LVL3 (GOWN DISPOSABLE) IMPLANT
GOWN STRL REUS W/TWL 2XL LVL3 (GOWN DISPOSABLE) IMPLANT
GOWN STRL REUS W/TWL LRG LVL3 (GOWN DISPOSABLE) ×6
GOWN STRL REUS W/TWL XL LVL3 (GOWN DISPOSABLE)
HEMOSTAT POWDER KIT SURGIFOAM (HEMOSTASIS) ×3 IMPLANT
KIT BASIN OR (CUSTOM PROCEDURE TRAY) ×3 IMPLANT
KIT TURNOVER KIT B (KITS) ×3 IMPLANT
NDL SPNL 22GX3.5 QUINCKE BK (NEEDLE) ×1 IMPLANT
NEEDLE HYPO 22GX1.5 SAFETY (NEEDLE) ×3 IMPLANT
NEEDLE SPNL 22GX3.5 QUINCKE BK (NEEDLE) ×3 IMPLANT
NS IRRIG 1000ML POUR BTL (IV SOLUTION) ×3 IMPLANT
OIL CARTRIDGE MAESTRO DRILL (MISCELLANEOUS) ×3
PACK LAMINECTOMY NEURO (CUSTOM PROCEDURE TRAY) ×3 IMPLANT
PAD ARMBOARD 7.5X6 YLW CONV (MISCELLANEOUS) ×3 IMPLANT
PLATE 2 37.5XLCK NS SPNE CVD (Plate) IMPLANT
PLATE 2 ATLANTIS TRANS (Plate) ×2 IMPLANT
PUTTY DBF 1CC CORTICAL FIBERS (Putty) ×2 IMPLANT
RUBBERBAND STERILE (MISCELLANEOUS) ×6 IMPLANT
SCREW SELF TAP VAR 4.0X13 (Screw) ×12 IMPLANT
SPONGE INTESTINAL PEANUT (DISPOSABLE) ×3 IMPLANT
SPONGE SURGIFOAM ABS GEL 100 (HEMOSTASIS) ×3 IMPLANT
STRIP CLOSURE SKIN 1/2X4 (GAUZE/BANDAGES/DRESSINGS) ×1 IMPLANT
SUT ETHILON 3 0 FSL (SUTURE) IMPLANT
SUT VIC AB 3-0 SH 8-18 (SUTURE) ×3 IMPLANT
SUT VICRYL 3-0 RB1 18 ABS (SUTURE) ×3 IMPLANT
TAPE CLOTH 3X10 TAN LF (GAUZE/BANDAGES/DRESSINGS) ×3 IMPLANT
TOWEL GREEN STERILE (TOWEL DISPOSABLE) ×3 IMPLANT
TOWEL GREEN STERILE FF (TOWEL DISPOSABLE) ×3 IMPLANT
WATER STERILE IRR 1000ML POUR (IV SOLUTION) ×3 IMPLANT

## 2018-10-30 NOTE — Progress Notes (Signed)
Patient is discharged from room 3C06 at this time. Alert and in stable condition. IV site d/c'd and instructions read to patient with understanding verbalized. Left unit via wheelchair with all belongings at side.  

## 2018-10-30 NOTE — Progress Notes (Signed)
Orthopedic Tech Progress Note Patient Details:  SHONICA WEIER 06-04-1975 [id number] PACU RN called requesting SOFT COLLAR Ortho Devices Type of Ortho Device: Soft collar Ortho Device/Splint Location: neck Ortho Device/Splint Interventions: Adjustment, Application, Ordered   Post Interventions Patient Tolerated: Well Instructions Provided: Care of device, Adjustment of device   Janit Pagan 10/30/2018, 11:11 AM

## 2018-10-30 NOTE — Discharge Summary (Signed)
Physician Discharge Summary  Patient ID: [name] MRN: [id number] DOB/AGE: 41-Dec-1977 41 y.o.  Admit date: 10/30/2018 Discharge date: 10/30/2018  Admission Diagnoses:  Cervical radiculopathy  Discharge Diagnoses:  Same Active Problems:   Cervical radiculopathy   Discharged Condition: Stable  Hospital Course:  [name] is a 43 y.o. female who was admitted for the below procedure. There were no post operative complications. At time of discharge, pain was well controlled, ambulating, tolerating po, voiding normal. Ready for discharge.  Treatments: Surgery - C5-6, C6-7 ACDF  Discharge Exam: Blood pressure 105/66, pulse 86, temperature 98.2 F (36.8 C), resp. rate 14, height 5\' 4"  (1.626 m), weight 71.2 kg, last menstrual period 09/09/2018, SpO2 97 %. Awake, alert, oriented Speech fluent, appropriate CN grossly intact 5/5 BUE/BLE Wound c/d/i  Disposition: Discharge disposition: 01-Home or Self Care       Discharge Instructions    Call MD for:  difficulty breathing, headache or visual disturbances   Complete by: As directed    Call MD for:  persistant dizziness or light-headedness   Complete by: As directed    Call MD for:  redness, tenderness, or signs of infection (pain, swelling, redness, odor or green/yellow discharge around incision site)   Complete by: As directed    Call MD for:  severe uncontrolled pain   Complete by: As directed    Call MD for:  temperature >100.4   Complete by: As directed    Diet general   Complete by: As directed    Driving Restrictions   Complete by: As directed    Do not drive until given clearance.   Increase activity slowly   Complete by: As directed    Lifting restrictions   Complete by: As directed    Do not lift anything >10lbs. Avoid bending and twisting in awkward positions. Avoid bending at the back.   May shower / Bathe   Complete by: As directed    In 24 hours. Okay to wash wound with warm soapy water.  Avoid scrubbing the wound. Pat dry.   Remove dressing in 24 hours   Complete by: As directed      Allergies as of 10/30/2018      Reactions   Levaquin [levofloxacin] Other (See Comments)   Neuro symptoms/hallucinations      Medication List    TAKE these medications   APAP 325 MG tablet He can take 2 tablets every 4 hours as needed. Tylenol(acetaminophen) is in your prescription pain medication. You cannot take more than 4000 mg of Tylenol/acetaminophen per day. You need to count the contents of each tablet to calculate dose for 24 hour period. What changed:   how much to take  how to take this  when to take this  reasons to take this  additional instructions   AZELASTINE-FLUTICASONE NA Place 1 spray into both nostrils daily as needed (allergies).   clobetasol 0.05 % external solution Commonly known as: TEMOVATE Apply 1 application topically 2 (two) times daily as needed (psoriasis).   escitalopram 20 MG tablet Commonly known as: LEXAPRO Take 10 mg by mouth at bedtime.   imipramine 25 MG tablet Commonly known as: Tofranil Take one or two tablets nightly   indomethacin 25 MG capsule Commonly known as: INDOCIN Take 1 capsule (25 mg total) by mouth 3 (three) times daily as needed.   levocetirizine 5 MG tablet Commonly known as: XYZAL Take 5 mg by mouth at bedtime as needed for allergies.   LORazepam 0.5  MG tablet Commonly known as: ATIVAN Take 0.5 mg by mouth daily as needed for anxiety.   methocarbamol 750 MG tablet Commonly known as: Robaxin-750 Take 1 tablet (750 mg total) by mouth 3 (three) times daily as needed for muscle spasms.   norgestimate-ethinyl estradiol 0.25-35 MG-MCG tablet Commonly known as: ORTHO-CYCLEN Take 1 tablet by mouth daily.   oxyCODONE-acetaminophen 7.5-325 MG tablet Commonly known as: Percocet Take 1 tablet by mouth every 4 (four) hours as needed for severe pain.   Pazeo 0.7 % Soln Generic drug: Olopatadine HCl Place 1 drop  into both eyes daily as needed (itching).   promethazine 25 MG tablet Commonly known as: PHENERGAN TAKE 1 TABLET (25 MG TOTAL) BY MOUTH EVERY 6 (SIX) HOURS AS NEEDED FOR NAUSEA OR VOMITING.   SUMAtriptan 100 MG tablet Commonly known as: IMITREX TAKE 1 TABLET AT ONSET OF MIGRAINE, MAY REPEAT IN 2 HOURS IF HEADACHE PERSISTS OR RECURS What changed: See the new instructions.      Follow-up Information    Lisbeth RenshawNundkumar, Neelesh, MD. Schedule an appointment as soon as possible for a visit in 3 week(s).   Specialty: Neurosurgery Contact information: [address] [place] [address] [phone]           Signed: Alyson InglesCOSTELLA, Jhon Mallozzi J 10/30/2018, 10:59 AM

## 2018-10-30 NOTE — Discharge Instructions (Signed)
Wound Care  °You may remove outer bandage after 2 days and shower.  °Keep incision open to air. °Do not put any creams, lotions, or ointments on incision. ° °Activity °Walk each and every day, increasing distance each day. °No lifting greater than 5 lbs.  Avoid excessive neck motion. °No driving for 2 weeks; may ride as a passenger locally. °Wear neck brace at all times except when showering. °Diet °Resume your normal diet.  °Return to Work °Will be discussed at you follow up appointment. °Call Your Doctor If Any of These Occur °Redness, drainage, or swelling at the wound.  °Temperature greater than 101 degrees. °Severe pain not relieved by pain medication. °Increased difficulty swallowing.  °Incision starts to come apart. °Follow Up Appt °Call today and ask for appointment in 1-2 weeks ([phone]) or for problems.  If you have any hardware placed in your spine, you will need an x-ray before your appointment. ° °

## 2018-10-30 NOTE — Evaluation (Signed)
Occupational Therapy Evaluation Patient Details Name: [name] MRN: [id number] DOB: March 14, 1975 Today's Date: 10/30/2018    History of Present Illness Pt is a 43 yo female s/p C5-6, C6-7 ACDF. PMHx: anxiety, foot surgery, migraines.   Clinical Impression   Pt PTA: living with children and independent. Pt staying with a friend to assist as needed. Pt performing UB ADL with no assist and LB ADL with no assist, just supervision, but no verbal cues require for proper technique. Pt able to perform figure 4 technique for LB dress. Back handout provided and reviewed ADL in detail. Pt performing stairs- a full flight with supervisionA only. Pt ambulating a community distance with no physical assist. Pt educated on: clothing between brace, never sleep in brace, set an alarm at night for medication, avoid sitting for long periods of time, correct bed positioning for sleeping, correct sequence for bed mobility, avoiding lifting more than 5 pounds and never wash directly over incision. All education is complete and patient indicates understanding. Pt does not require additional OT. OT signing off.       Follow Up Recommendations  No OT follow up    Equipment Recommendations  None recommended by OT    Recommendations for Other Services       Precautions / Restrictions Precautions Precautions: None Restrictions Weight Bearing Restrictions: No      Mobility Bed Mobility Overal bed mobility: Needs Assistance Bed Mobility: Supine to Sit;Sit to Sidelying     Supine to sit: Supervision   Sit to sidelying: Supervision General bed mobility comments: log roll technique initiated  Transfers Overall transfer level: Modified independent                    Balance Overall balance assessment: No apparent balance deficits (not formally assessed)                                         ADL either performed or assessed with clinical judgement   ADL Overall ADL's  : Modified independent                                       General ADL Comments: Pt performing UB ADL with no assist and LB ADL with no assist, just supervision, but no verbal cues require for propr technique. Pt able to perform figure 4 technique for LB dress.      Vision Baseline Vision/History: No visual deficits Vision Assessment?: No apparent visual deficits     Perception     Praxis      Pertinent Vitals/Pain Pain Assessment: 0-10 Pain Score: 7  Pain Location: pressure in throat and back Pain Descriptors / Indicators: Discomfort Pain Intervention(s): Limited activity within patient's tolerance     Hand Dominance Right   Extremity/Trunk Assessment Upper Extremity Assessment Upper Extremity Assessment: Overall WFL for tasks assessed   Lower Extremity Assessment Lower Extremity Assessment: Overall WFL for tasks assessed   Cervical / Trunk Assessment Cervical / Trunk Assessment: Other exceptions Cervical / Trunk Exceptions: s/p cervical sx   Communication Communication Communication: No difficulties   Cognition Arousal/Alertness: Awake/alert Behavior During Therapy: WFL for tasks assessed/performed Overall Cognitive Status: Within Functional Limits for tasks assessed  General Comments       Exercises     Shoulder Instructions      Home Living Family/patient expects to be discharged to:: Private residence Living Arrangements: Children;Other relatives(ex-significant other ) Available Help at Discharge: Family;Available 24 hours/day Type of Home: Apartment Home Access: Stairs to enter Entergy CorporationEntrance Stairs-Number of Steps: a full flight Entrance Stairs-Rails: Can reach both Home Layout: One level     Bathroom Shower/Tub: Chief Strategy OfficerTub/shower unit   Bathroom Toilet: [name]     Home Equipment: None          Prior Functioning/Environment Level of Independence: Independent                  OT Problem List: Decreased strength;Impaired balance (sitting and/or standing);Decreased activity tolerance;Decreased safety awareness;Pain      OT Treatment/Interventions:      OT Goals(Current goals can be found in the care plan section)    OT Frequency:     Barriers to D/C:            Co-evaluation              AM-PAC OT "6 Clicks" Daily Activity     Outcome Measure Help from another person eating meals?: None Help from another person taking care of personal grooming?: None Help from another person toileting, which includes using toliet, bedpan, or urinal?: None Help from another person bathing (including washing, rinsing, drying)?: A Little Help from another person to put on and taking off regular upper body clothing?: A Little Help from another person to put on and taking off regular lower body clothing?: None 6 Click Score: 22   End of Session Equipment Utilized During Treatment: Cervical collar Nurse Communication: Mobility status  Activity Tolerance: Patient tolerated treatment well;Patient limited by pain Patient left: in bed;with call bell/phone within reach  OT Visit Diagnosis: Unsteadiness on feet (R26.81);Muscle weakness (generalized) (M62.81)                Time: 1300-1330 OT Time Calculation (min): 30 min Charges:  OT General Charges $OT Visit: 1 Visit OT Evaluation $OT Eval Moderate Complexity: 1 Mod OT Treatments $Self Care/Home Management : 8-22 mins  Mary StandardAllison Cecil Cranker(Jelenek) Glendell [name] [name]: [phone] Office: [phone]   [name] 10/30/2018, 1:48 PM

## 2018-10-30 NOTE — Op Note (Signed)
NEUROSURGERY OPERATIVE NOTE   PREOP DIAGNOSIS: Cervical Spondylosis with radiculopathy, C5-6, C6-7  POSTOP DIAGNOSIS: Same  PROCEDURE: 1. Discectomy at C5-6, C6-7 for decompression of spinal cord and exiting nerve roots  2. Placement of intervertebral biomechanical device, Medtronic Titan 6mm lordotic  3. Placement of anterior instrumentation consisting of interbody plate and screws spanning C5-C7 - Medtronic Atlantis translational plate 4. Use of morselized bone allograft  5. Arthrodesis C5-6, C6-7, anterior interbody technique  6. Use of intraoperative microscope  SURGEON: Dr. Lisbeth RenshawNeelesh Dashea Mcmullan, MD  ASSISTANT: Cindra PresumeVincent Costella, PA-C  ANESTHESIA: General Endotracheal  EBL: 50cc  SPECIMENS: None  DRAINS: none  COMPLICATIONS: None immediate  CONDITION: Hemodynamically stable to PACU  HISTORY: [name] is a 43 y.o. initially followed in the outpatient neurosurgery clinic primarily with right-sided neck and arm pain.  Work-up included MRI which demonstrated disc osteophyte complex and resulting right-sided foraminal stenosis at C6-7 and to a lesser extent at C5-6.  She attempted multiple conservative treatments which have become ineffective.  She therefore elected to proceed with surgical decompression.  Risks and benefits of the surgery were reviewed in detail with the patient.  After all questions were answered informed consent was obtained and witnessed.  PROCEDURE IN DETAIL: The patient was brought to the operating room and transferred to the operative table. After induction of general anesthesia, the patient was positioned on the operative table in the supine position with all pressure points meticulously padded. The skin of the neck was then prepped and draped in the usual sterile fashion.  After timeout was conducted, the skin was infiltrated with local anesthetic. Skin incision was then made sharply and Bovie electrocautery was used to dissect the subcutaneous  tissue until the platysma was identified. The platysma was then divided and undermined. The sternocleidomastoid muscle was then identified and, utilizing natural fascial planes in the neck, the prevertebral fascia was identified and the carotid sheath was retracted laterally and the trachea and esophagus retracted medially. Again using fluoroscopy, the C6-7 disc space was identified with a needle inserted superficially.  Bovie electrocautery was used to dissect in the subperiosteal plane and elevate the bilateral longus coli muscles. Table mounted retractors were then placed. At this point, the microscope was draped and brought into the field, and the remainder of the case was done under the microscope using microdissecting technique.  The C6-7 disc space was incised sharply and rongeurs were use to initially complete a discectomy. The high-speed drill was then used to complete discectomy until the posterior annulus was identified and removed and the posterior longitudinal ligament was identified. Using a nerve hook, the PLL was elevated, and Kerrison rongeurs were used to remove the posterior longitudinal ligament and the ventral thecal sac was identified. Using a combination of curettes and rongeurs, complete decompression of the thecal sac and exiting nerve roots at this level was completed.  I was then able to freely pass a small microdissector out the C6-7 foramen indicating good decompression.  A 6 mm lordotic interbody cage was sized and packed with morcellized bone allograft. This was then inserted and tapped into place.  In a similar fashion, the C5-6 disc space was incised, and superficial discectomy completed using pituitary rongeurs and cup curettes.  The remainder of the discectomy was completed with a high-speed drill including removal of posterior osteophytes from the posterior margins of the C5 and C6 vertebral bodies.  The posterior longitudinal ligament was identified and elevated, and  removed piecemeal using Kerrison punches.  Decompression was  carried laterally to fully decompress the foramina.  Again, I was freely able to pass a small microdissector out the foramina indicating good decompression.  A 6 mm lordotic interbody cage was then sized and filled with bone allograft, and tapped into place. Position of the interbody devices was then confirmed with fluoroscopy.  After placement of the intervertebral devices, the above anterior cervical plate was selected, and placed across the interspaces. Using a high-speed drill, the cortex of the cervical vertebral bodies was punctured, and screws inserted in the C5, C6, and C7 levels. Final fluoroscopic images in lateral projection were taken to confirm good hardware placement.  At this point, after all counts were verified to be correct, meticulous hemostasis was secured using a combination of bipolar electrocautery and passive hemostatics. The platysma muscle was then closed using interrupted 3-0 Vicryl sutures, and the skin was closed with a interrupted subcuticular stitch. Sterile dressings were then applied and the drapes removed.  The patient tolerated the procedure well and was extubated in the room and taken to the postanesthesia care unit in stable condition.  At the end of the case all sponge, needle, instrument, and cottonoid counts were correct.

## 2018-10-30 NOTE — Transfer of Care (Signed)
Immediate Anesthesia Transfer of Care Note  Patient: Ellison Carwin  Procedure(s) Performed: ANTERIOR CERVICAL DECOMPRESSION/DISCECTOMY FUSION, CERVICAL FIVE- CERVICAL SIX, CERVICAL SIX- CERVICAL SEVEN (N/A )  Patient Location: PACU  Anesthesia Type:General  Level of Consciousness: awake, alert  and oriented  Airway & Oxygen Therapy: Patient Spontanous Breathing and Patient connected to nasal cannula oxygen  Post-op Assessment: Report given to RN and Post -op Vital signs reviewed and stable  Post vital signs: Reviewed and stable  Last Vitals:  Vitals Value Taken Time  BP 105/66 10/30/18 0955  Temp    Pulse 81 10/30/18 0957  Resp 14 10/30/18 0957  SpO2 97 % 10/30/18 0957  Vitals shown include unvalidated device data.  Last Pain:  Vitals:   10/30/18 0627  TempSrc:   PainSc: 2       Patients Stated Pain Goal: 3 (23/34/35 6861)  Complications: No apparent anesthesia complications

## 2018-10-30 NOTE — Anesthesia Postprocedure Evaluation (Signed)
Anesthesia Post Note  Patient: [name]  Procedure(s) Performed: ANTERIOR CERVICAL DECOMPRESSION/DISCECTOMY FUSION, CERVICAL FIVE- CERVICAL SIX, CERVICAL SIX- CERVICAL SEVEN (N/A )     Patient location during evaluation: PACU Anesthesia Type: General Level of consciousness: awake and alert Pain management: pain level controlled Vital Signs Assessment: post-procedure vital signs reviewed and stable Respiratory status: spontaneous breathing, nonlabored ventilation, respiratory function stable and patient connected to nasal cannula oxygen Cardiovascular status: blood pressure returned to baseline and stable Postop Assessment: no apparent nausea or vomiting Anesthetic complications: no    Last Vitals:  Vitals:   10/30/18 1100 10/30/18 1133  BP:  103/77  Pulse: 75 74  Resp: 19 18  Temp: 36.7 C 37 C  SpO2: 96% 96%    Last Pain:  Vitals:   10/30/18 1342  TempSrc:   PainSc: 4                  Ryan P Ellender

## 2018-10-30 NOTE — Anesthesia Procedure Notes (Signed)
Procedure Name: Intubation Date/Time: 10/30/2018 7:57 AM Performed by: Clearnce Sorrel, CRNA Pre-anesthesia Checklist: Patient identified, Emergency Drugs available, Suction available, Patient being monitored and Timeout performed Patient Re-evaluated:Patient Re-evaluated prior to induction Oxygen Delivery Method: Circle system utilized Preoxygenation: Pre-oxygenation with 100% oxygen Induction Type: IV induction Ventilation: Mask ventilation without difficulty Laryngoscope Size: Glidescope and 3 (limited neck ROM) Grade View: Grade I Tube type: Oral Tube size: 7.0 mm Number of attempts: 1 Airway Equipment and Method: Stylet Placement Confirmation: ETT inserted through vocal cords under direct vision,  positive ETCO2 and breath sounds checked- equal and bilateral Secured at: 22 cm Tube secured with: Tape Dental Injury: Teeth and Oropharynx as per pre-operative assessment

## 2018-11-03 ENCOUNTER — Encounter (HOSPITAL_COMMUNITY): Payer: Self-pay | Admitting: Neurosurgery

## 2019-01-12 ENCOUNTER — Other Ambulatory Visit: Payer: Self-pay | Admitting: Nurse Practitioner

## 2019-01-12 DIAGNOSIS — Z1231 Encounter for screening mammogram for malignant neoplasm of breast: Secondary | ICD-10-CM

## 2019-02-01 NOTE — Telephone Encounter (Signed)
We received a Sumatriptan 100 mg PA. I called [facility] and spoke with [place].  She stated the pt picked it up last on 01/26/2019. PA will not be done at this time.

## 2019-02-05 ENCOUNTER — Other Ambulatory Visit: Payer: Self-pay

## 2019-02-05 DIAGNOSIS — Z20822 Contact with and (suspected) exposure to covid-19: Secondary | ICD-10-CM

## 2019-02-06 LAB — NOVEL CORONAVIRUS, NAA: SARS-CoV-2, NAA: NOT DETECTED

## 2019-02-13 IMAGING — MG DIGITAL SCREENING BILATERAL MAMMOGRAM WITH TOMO AND CAD
8 series · 8 of 24 positions shown · non-contrast
Comparison: Previous exam(s).

CLINICAL DATA: Screening.

EXAM:
DIGITAL SCREENING BILATERAL MAMMOGRAM WITH TOMO AND CAD

[L CC synth-2D]
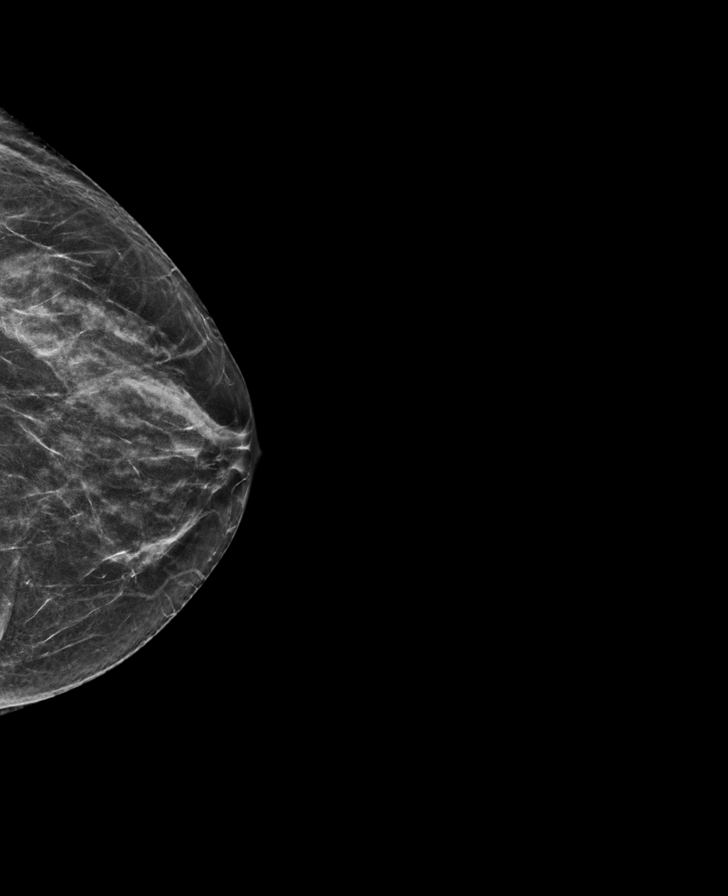

[L MLO synth-2D]
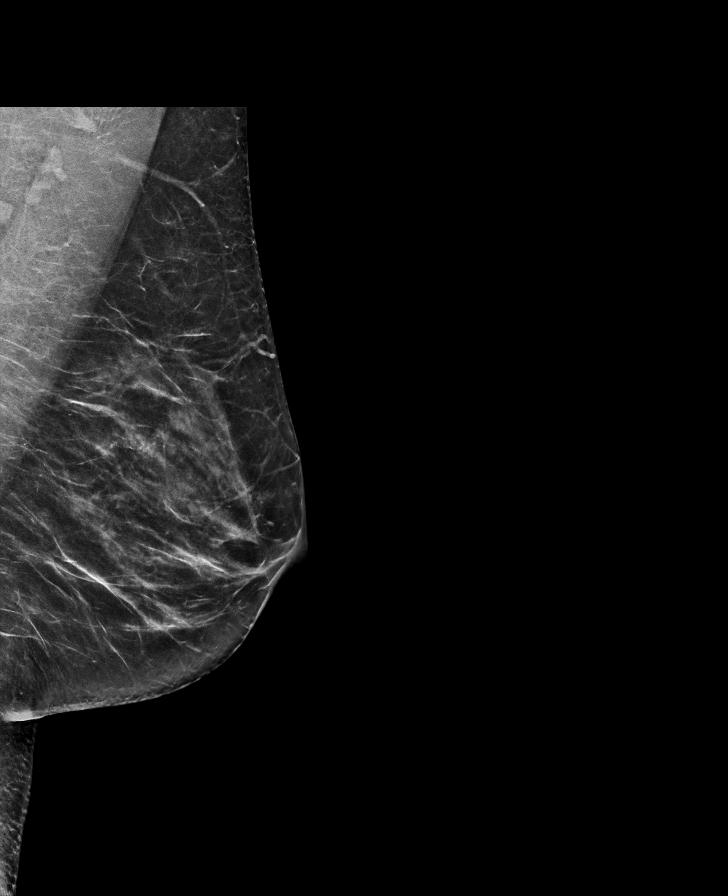

[R MLO synth-2D]
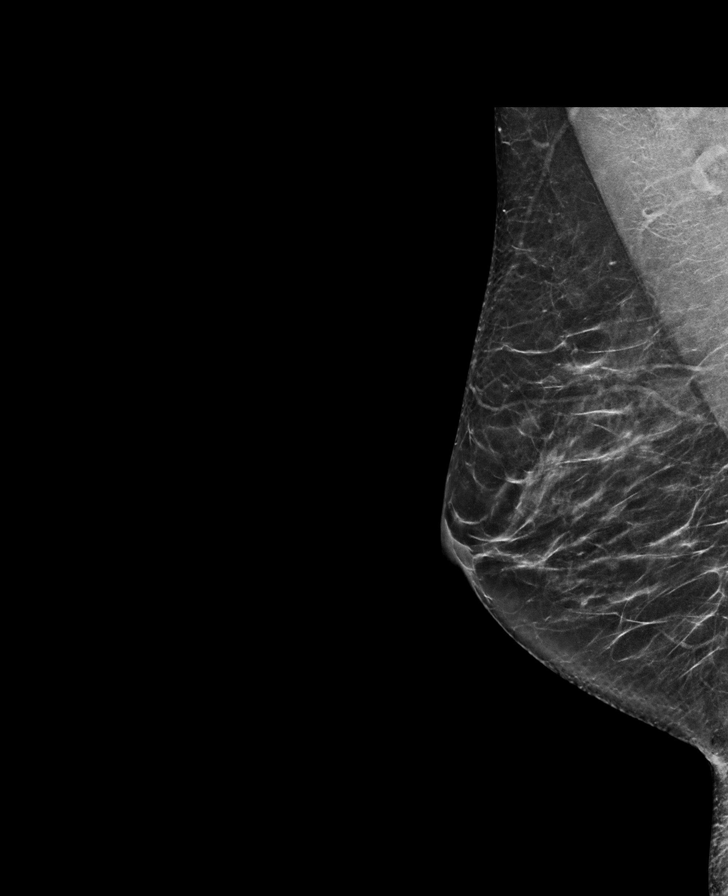

[R CC synth-2D]
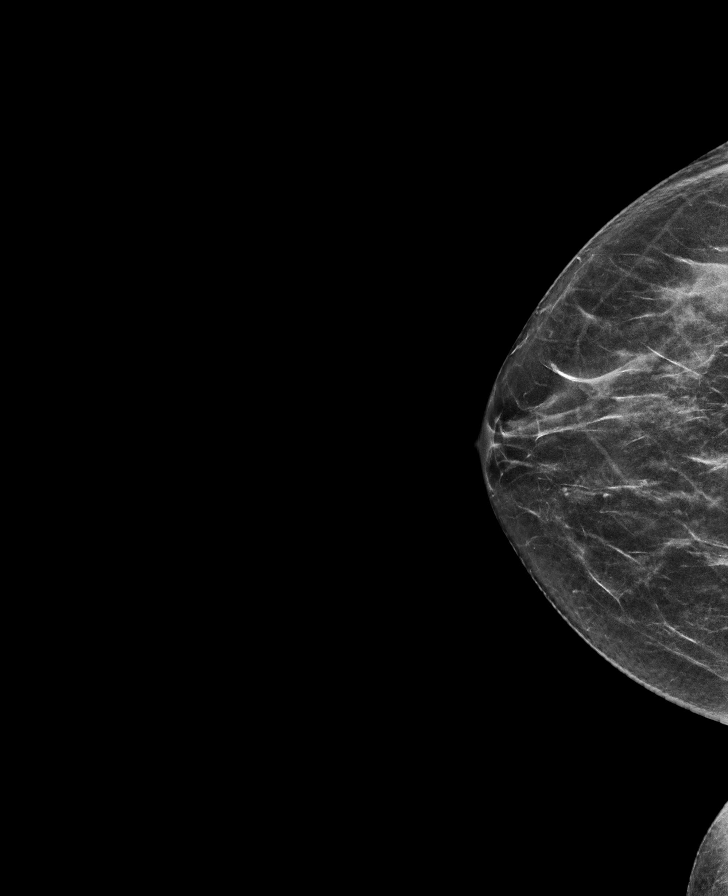

[R MLO tomo · tomo slice 33/65.0]
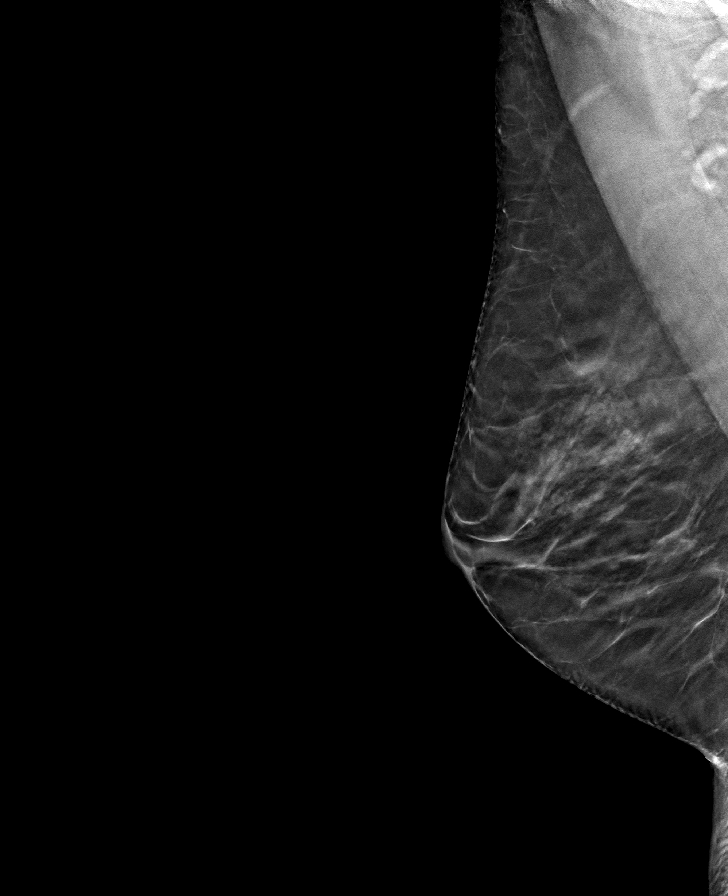

[L MLO tomo · tomo slice 35/70.0]
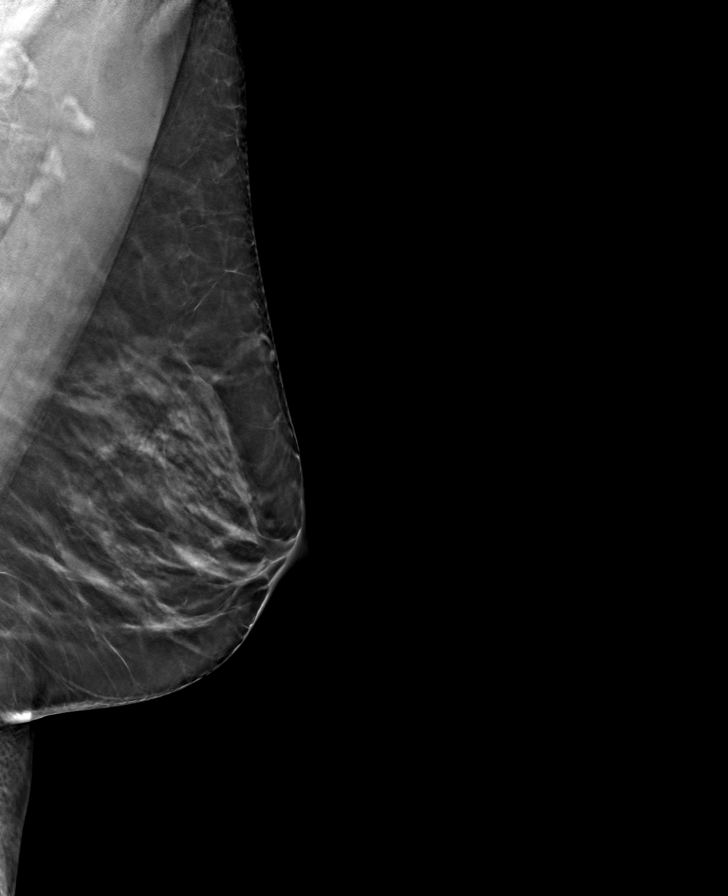

[R CC tomo · tomo slice 30/59.0]
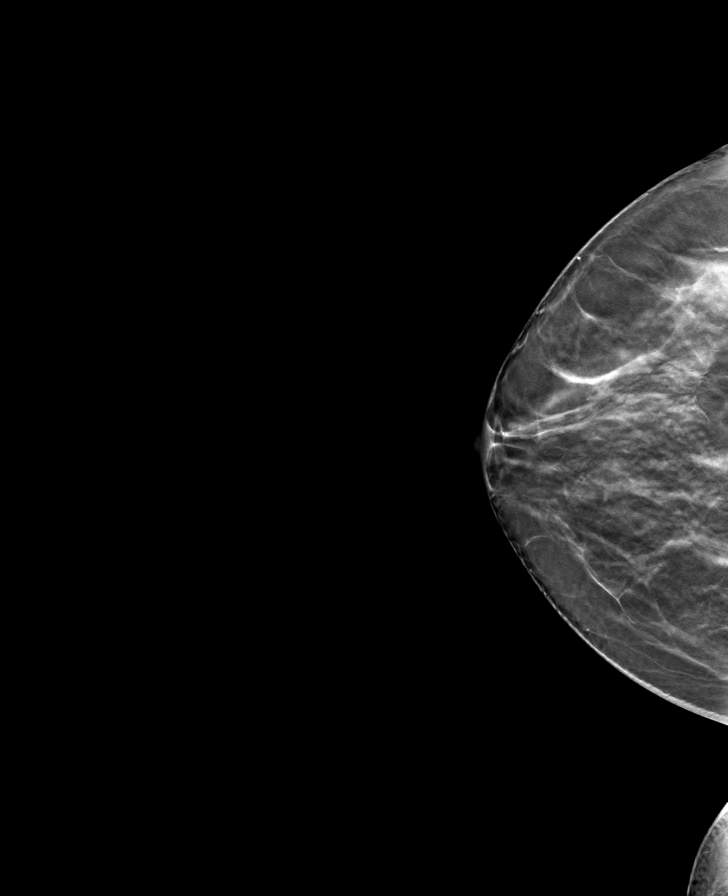

[L CC tomo · tomo slice 31/61.0]
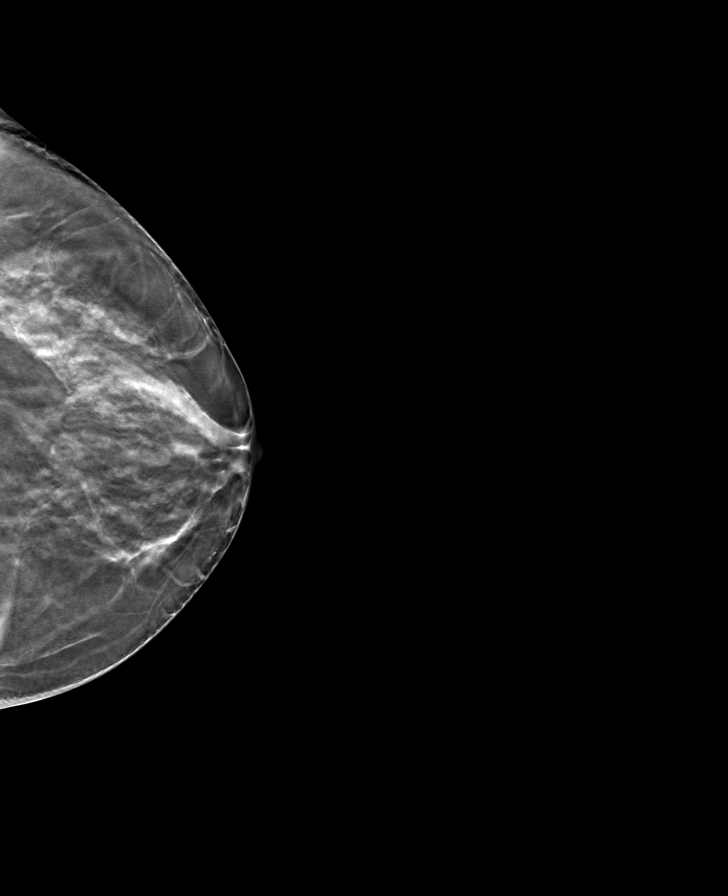

[8 of 24 positions shown; findings below may reference images not displayed]

ACR Breast Density Category c: The breast tissue is heterogeneously
dense, which may obscure small masses.
FINDINGS: There are no findings suspicious for malignancy. Images were
processed with CAD.
IMPRESSION: No mammographic evidence of malignancy. A result letter of this
screening mammogram will be mailed directly to the patient.

RECOMMENDATION:
Screening mammogram in one year. (Code:[id number])

BI-RADS CATEGORY  1: Negative.

## 2019-03-05 ENCOUNTER — Ambulatory Visit: Payer: Medicaid Other

## 2019-03-23 ENCOUNTER — Other Ambulatory Visit: Payer: Self-pay | Admitting: Neurology

## 2019-03-24 ENCOUNTER — Other Ambulatory Visit: Payer: Self-pay | Admitting: Neurology

## 2019-03-25 ENCOUNTER — Other Ambulatory Visit: Payer: Self-pay | Admitting: Neurology

## 2019-03-29 NOTE — Telephone Encounter (Signed)
I called [facility] and spoke with Revonda Standard. Pt was trying to refill sumatriptan too soon. She refilled on 10/28/18 #10, 12/01/18 #10, 12/24/18 she paid OOP #3, 12/27/18 #10, 01/2019 #10, 02/26/19 #10 and she has 7 tabs remaining on prescription. They will get this ready for her.

## 2019-03-31 ENCOUNTER — Encounter: Payer: Self-pay | Admitting: Family Medicine

## 2019-03-31 ENCOUNTER — Other Ambulatory Visit: Payer: Self-pay

## 2019-03-31 ENCOUNTER — Ambulatory Visit: Payer: Medicaid Other | Admitting: Family Medicine

## 2019-03-31 VITALS — BP 114/69 | HR 71 | Temp 96.9°F | Ht 64.0 in | Wt 163.8 lb

## 2019-03-31 DIAGNOSIS — IMO0002 Reserved for concepts with insufficient information to code with codable children: Secondary | ICD-10-CM

## 2019-03-31 DIAGNOSIS — G43709 Chronic migraine without aura, not intractable, without status migrainosus: Secondary | ICD-10-CM | POA: Diagnosis not present

## 2019-03-31 MED ORDER — SUMATRIPTAN SUCCINATE 100 MG PO TABS: 100.0000 mg | ORAL_TABLET | ORAL | 11 refills | Status: AC | PRN

## 2019-03-31 MED ORDER — AJOVY 225 MG/1.5ML ~~LOC~~ SOAJ: 225.0000 mg | SUBCUTANEOUS | 11 refills | Status: AC

## 2019-03-31 MED ORDER — AJOVY 225 MG/1.5ML ~~LOC~~ SOAJ: 225.0000 mg | SUBCUTANEOUS | 0 refills | Status: AC
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
## 2019-03-31 NOTE — Progress Notes (Signed)
I have read the note, and I agree with the clinical assessment and plan.  Maleki Hippe A. Nathaniel Yaden, MD, PhD, FAAN Certified in Neurology, Clinical Neurophysiology, Sleep Medicine, Pain Medicine and Neuroimaging  [facility] [address], [address] [place], [place] [address] [phone]  

## 2019-03-31 NOTE — Patient Instructions (Signed)
We will start Ajovy injections monthly as demonstrated in the office   Continue sumatriptan as needed for abortive therapy but try to limit use.   Work on regular exercise, get plenty of water and remember sleep hygiene   Follow up in 3 months   Migraine Headache A migraine headache is a very strong throbbing pain on one side or both sides of your head. This type of headache can also cause other symptoms. It can last from 4 hours to 3 days. Talk with your doctor about what things may bring on (trigger) this condition. What are the causes? The exact cause of this condition is not known. This condition may be triggered or caused by:  Drinking alcohol.  Smoking.  Taking medicines, such as: ? Medicine used to treat chest pain (nitroglycerin). ? Birth control pills. ? Estrogen. ? Some blood pressure medicines.  Eating or drinking certain products.  Doing physical activity. Other things that may trigger a migraine headache include:  Having a menstrual period.  Pregnancy.  Hunger.  Stress.  Not getting enough sleep or getting too much sleep.  Weather changes.  Tiredness (fatigue). What increases the risk?  Being 80-79 years old.  Being female.  Having a family history of migraine headaches.  Being Caucasian.  Having depression or anxiety.  Being very overweight. What are the signs or symptoms?  A throbbing pain. This pain may: ? Happen in any area of the head, such as on one side or both sides. ? Make it hard to do daily activities. ? Get worse with physical activity. ? Get worse around bright lights or loud noises.  Other symptoms may include: ? Feeling sick to your stomach (nauseous). ? Vomiting. ? Dizziness. ? Being sensitive to bright lights, loud noises, or smells.  Before you get a migraine headache, you may get warning signs (an aura). An aura may include: ? Seeing flashing lights or having blind spots. ? Seeing bright spots, halos, or zigzag  lines. ? Having tunnel vision or blurred vision. ? Having numbness or a tingling feeling. ? Having trouble talking. ? Having weak muscles.  Some people have symptoms after a migraine headache (postdromal phase), such as: ? Tiredness. ? Trouble thinking (concentrating). How is this treated?  Taking medicines that: ? Relieve pain. ? Relieve the feeling of being sick to your stomach. ? Prevent migraine headaches.  Treatment may also include: ? Having acupuncture. ? Avoiding foods that bring on migraine headaches. ? Learning ways to control your body functions (biofeedback). ? Therapy to help you know and deal with negative thoughts (cognitive behavioral therapy). Follow these instructions at home: Medicines  Take over-the-counter and prescription medicines only as told by your doctor.  Ask your doctor if the medicine prescribed to you: ? Requires you to avoid driving or using heavy machinery. ? Can cause trouble pooping (constipation). You may need to take these steps to prevent or treat trouble pooping:  Drink enough fluid to keep your pee (urine) pale yellow.  Take over-the-counter or prescription medicines.  Eat foods that are high in fiber. These include beans, whole grains, and fresh fruits and vegetables.  Limit foods that are high in fat and sugar. These include fried or sweet foods. Lifestyle  Do not drink alcohol.  Do not use any products that contain nicotine or tobacco, such as cigarettes, e-cigarettes, and chewing tobacco. If you need help quitting, ask your doctor.  Get at least 8 hours of sleep every night.  Limit and deal with  stress. General instructions      Keep a journal to find out what may bring on your migraine headaches. For example, write down: ? What you eat and drink. ? How much sleep you get. ? Any change in what you eat or drink. ? Any change in your medicines.  If you have a migraine headache: ? Avoid things that make your symptoms  worse, such as bright lights. ? It may help to lie down in a dark, quiet room. ? Do not drive or use heavy machinery. ? Ask your doctor what activities are safe for you.  Keep all follow-up visits as told by your doctor. This is important. Contact a doctor if:  You get a migraine headache that is different or worse than others you have had.  You have more than 15 headache days in one month. Get help right away if:  Your migraine headache gets very bad.  Your migraine headache lasts longer than 72 hours.  You have a fever.  You have a stiff neck.  You have trouble seeing.  Your muscles feel weak or like you cannot control them.  You start to lose your balance a lot.  You start to have trouble walking.  You pass out (faint).  You have a seizure. Summary  A migraine headache is a very strong throbbing pain on one side or both sides of your head. These headaches can also cause other symptoms.  This condition may be treated with medicines and changes to your lifestyle.  Keep a journal to find out what may bring on your migraine headaches.  Contact a doctor if you get a migraine headache that is different or worse than others you have had.  Contact your doctor if you have more than 15 headache days in a month. This information is not intended to replace advice given to you by your health care provider. Make sure you discuss any questions you have with your health care provider. Document Revised: 06/05/2018 Document Reviewed: 03/26/2018 Elsevier Patient Education  2020 [place] injection What is this medicine? FREMANEZUMAB (fre ma NEZ ue mab) is used to prevent migraine headaches. This medicine may be used for other purposes; ask your health care provider or pharmacist if you have questions. COMMON BRAND NAME(S): AJOVY What should I tell my health care provider before I take this medicine? They need to know if you have any of these conditions:  an  unusual or allergic reaction to fremanezumab, other medicines, foods, dyes, or preservatives  pregnant or trying to get pregnant  breast-feeding How should I use this medicine? This medicine is for injection under the skin. You will be taught how to prepare and give this medicine. Use exactly as directed. Take your medicine at regular intervals. Do not take your medicine more often than directed. It is important that you put your used needles and syringes in a special sharps container. Do not put them in a trash can. If you do not have a sharps container, call your pharmacist or healthcare provider to get one. Talk to your pediatrician regarding the use of this medicine in children. Special care may be needed. Overdosage: If you think you have taken too much of this medicine contact a poison control center or emergency room at once. NOTE: This medicine is only for you. Do not share this medicine with others. What if I miss a dose? If you miss a dose, take it as soon as you can. If it  is almost time for your next dose, take only that dose. Do not take double or extra doses. What may interact with this medicine? Interactions are not expected. This list may not describe all possible interactions. Give your health care provider a list of all the medicines, herbs, non-prescription drugs, or dietary supplements you use. Also tell them if you smoke, drink alcohol, or use illegal drugs. Some items may interact with your medicine. What should I watch for while using this medicine? Tell your doctor or healthcare professional if your symptoms do not start to get better or if they get worse. What side effects may I notice from receiving this medicine? Side effects that you should report to your doctor or health care professional as soon as possible:  allergic reactions like skin rash, itching or hives, swelling of the face, lips, or tongue Side effects that usually do not require medical attention (report  these to your doctor or health care professional if they continue or are bothersome):  pain, redness, or irritation at site where injected This list may not describe all possible side effects. Call your doctor for medical advice about side effects. You may report side effects to FDA at 1-800-FDA-1088. Where should I keep my medicine? Keep out of the reach of children. You will be instructed on how to store this medicine. Throw away any unused medicine after the expiration date on the label. NOTE: This sheet is a summary. It may not cover all possible information. If you have questions about this medicine, talk to your doctor, pharmacist, or health care provider.  2020 Elsevier/Gold Standard (2016-11-11 17:22:56)

## 2019-04-05 ENCOUNTER — Ambulatory Visit: Payer: Medicaid Other

## 2019-04-07 ENCOUNTER — Encounter: Payer: Self-pay | Admitting: *Deleted

## 2019-04-07 ENCOUNTER — Telehealth: Payer: Self-pay | Admitting: *Deleted

## 2019-04-07 NOTE — Telephone Encounter (Signed)
Filled out [place] [employer] form for Ajovy. Messaged patient asking if she has had recent pregnancy test, if she is on birth control.

## 2019-04-08 NOTE — Telephone Encounter (Signed)
Called patient and phone rang several times, was picked up but no one spoke.

## 2019-04-12 ENCOUNTER — Ambulatory Visit: Payer: Medicaid Other | Admitting: Family Medicine

## 2019-04-27 ENCOUNTER — Encounter: Payer: Self-pay | Admitting: Family Medicine

## 2019-04-28 NOTE — Telephone Encounter (Signed)
Patient replied via my chart, stated there is no way she is pregnant. Noted on [place] [employer] for Ajovy. PA signed and faxed to [facility]. Advised patient that it is 24 hour turn around for decision. I will notify her.

## 2019-04-28 NOTE — Telephone Encounter (Signed)
Received my chart from patient yesterday:  Hey there. I'm still waiting on maybe approval or something for the injections. Do you know what the hold up is? I didn't know if you could tell what it was or if it wasn't approved. When I looked on the [facility] it said something about physician approval. I replied, advised her I had tried to reach her through my chart and VM. I asked her to provide information re: possibly pregnant.

## 2019-04-29 ENCOUNTER — Encounter: Payer: Self-pay | Admitting: *Deleted

## 2019-04-29 NOTE — Telephone Encounter (Addendum)
Per [employer], Ajovy approved x 90 days. Sent patient my chart to advise.

## 2019-05-04 ENCOUNTER — Other Ambulatory Visit: Payer: Self-pay | Admitting: Neurology

## 2019-05-04 DIAGNOSIS — G43709 Chronic migraine without aura, not intractable, without status migrainosus: Secondary | ICD-10-CM

## 2019-05-04 DIAGNOSIS — IMO0002 Reserved for concepts with insufficient information to code with codable children: Secondary | ICD-10-CM

## 2019-07-01 ENCOUNTER — Ambulatory Visit: Payer: Medicaid Other | Admitting: Family Medicine

## 2019-11-09 IMAGING — RF DG C-ARM 1-60 MIN
1 series · 1 of 1 positions shown · non-contrast
Comparison: None.

CLINICAL DATA: Status post surgical anterior fusion of C5-6 and
C6-7.

EXAM:
DG CERVICAL SPINE - 1 VIEW; DG C-ARM 1-60 MIN
FLUOROSCOPY TIME:  5 seconds.

[Series 1: run · 1 of 1 slices shown]
[im 1/1]
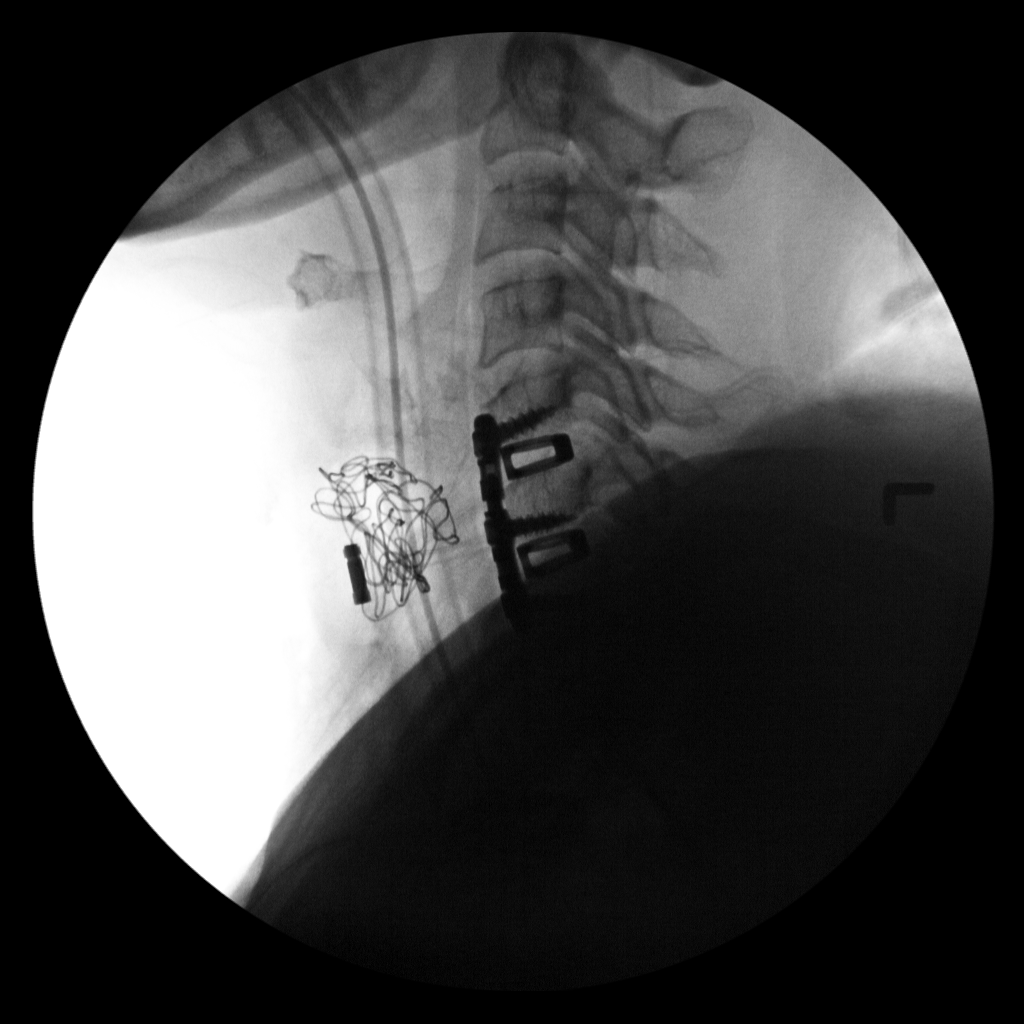

[1 of 1 positions shown; findings below may reference images not displayed]

FINDINGS: Single intraoperative fluoroscopic image was obtained of the
cervical spine. The patient is status post surgical anterior fusion
of C5-6 and C6-7. Good alignment of vertebral bodies is noted.
IMPRESSION: Fluoroscopic guidance was provided during surgical anterior fusion
of C5-6 and C6-7.

## 2023-12-24 DIAGNOSIS — L738 Other specified follicular disorders: Secondary | ICD-10-CM | POA: Diagnosis not present

## 2023-12-24 DIAGNOSIS — R21 Rash and other nonspecific skin eruption: Secondary | ICD-10-CM | POA: Diagnosis not present

## 2024-02-09 ENCOUNTER — Ambulatory Visit: Admitting: Family
